# Patient Record
Sex: Male | Born: 1945
Health system: Southern US, Community
[De-identification: ages and names within clinical notes are randomized; demographics above are authoritative.]

## PROBLEM LIST (undated history)

## (undated) DIAGNOSIS — M1990 Unspecified osteoarthritis, unspecified site: Secondary | ICD-10-CM

## (undated) DIAGNOSIS — I10 Essential (primary) hypertension: Secondary | ICD-10-CM

## (undated) DIAGNOSIS — R7301 Impaired fasting glucose: Secondary | ICD-10-CM

## (undated) HISTORY — DX: Impaired fasting glucose: R73.01

## (undated) HISTORY — DX: Essential (primary) hypertension: I10

---
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
## 2004-10-31 ENCOUNTER — Ambulatory Visit: Admission: RE | Admit: 2004-10-31 | Discharge: 2004-10-31 | Payer: Self-pay | Admitting: Orthopedic Surgery

## 2005-01-29 HISTORY — PX: OTHER SURGICAL HISTORY: SHX169

## 2005-01-29 HISTORY — PX: JOINT REPLACEMENT: SHX530

## 2005-04-09 ENCOUNTER — Inpatient Hospital Stay (HOSPITAL_COMMUNITY): Admission: RE | Admit: 2005-04-09 | Discharge: 2005-04-12 | Payer: Self-pay | Admitting: Orthopedic Surgery

## 2005-04-09 ENCOUNTER — Ambulatory Visit: Payer: Self-pay | Admitting: Physical Medicine & Rehabilitation

## 2005-07-02 ENCOUNTER — Inpatient Hospital Stay (HOSPITAL_COMMUNITY): Admission: RE | Admit: 2005-07-02 | Discharge: 2005-07-05 | Payer: Self-pay | Admitting: Orthopedic Surgery

## 2007-04-07 IMAGING — CR DG PORTABLE PELVIS
1 series · 1 of 1 positions shown · non-contrast
Comparison: none

CLINICAL DATA: Post-op left hip.
 PORTABLE PELVIS ? 1 VIEW ? 04/09/05 AT 7197 HOURS:

[view not recorded]
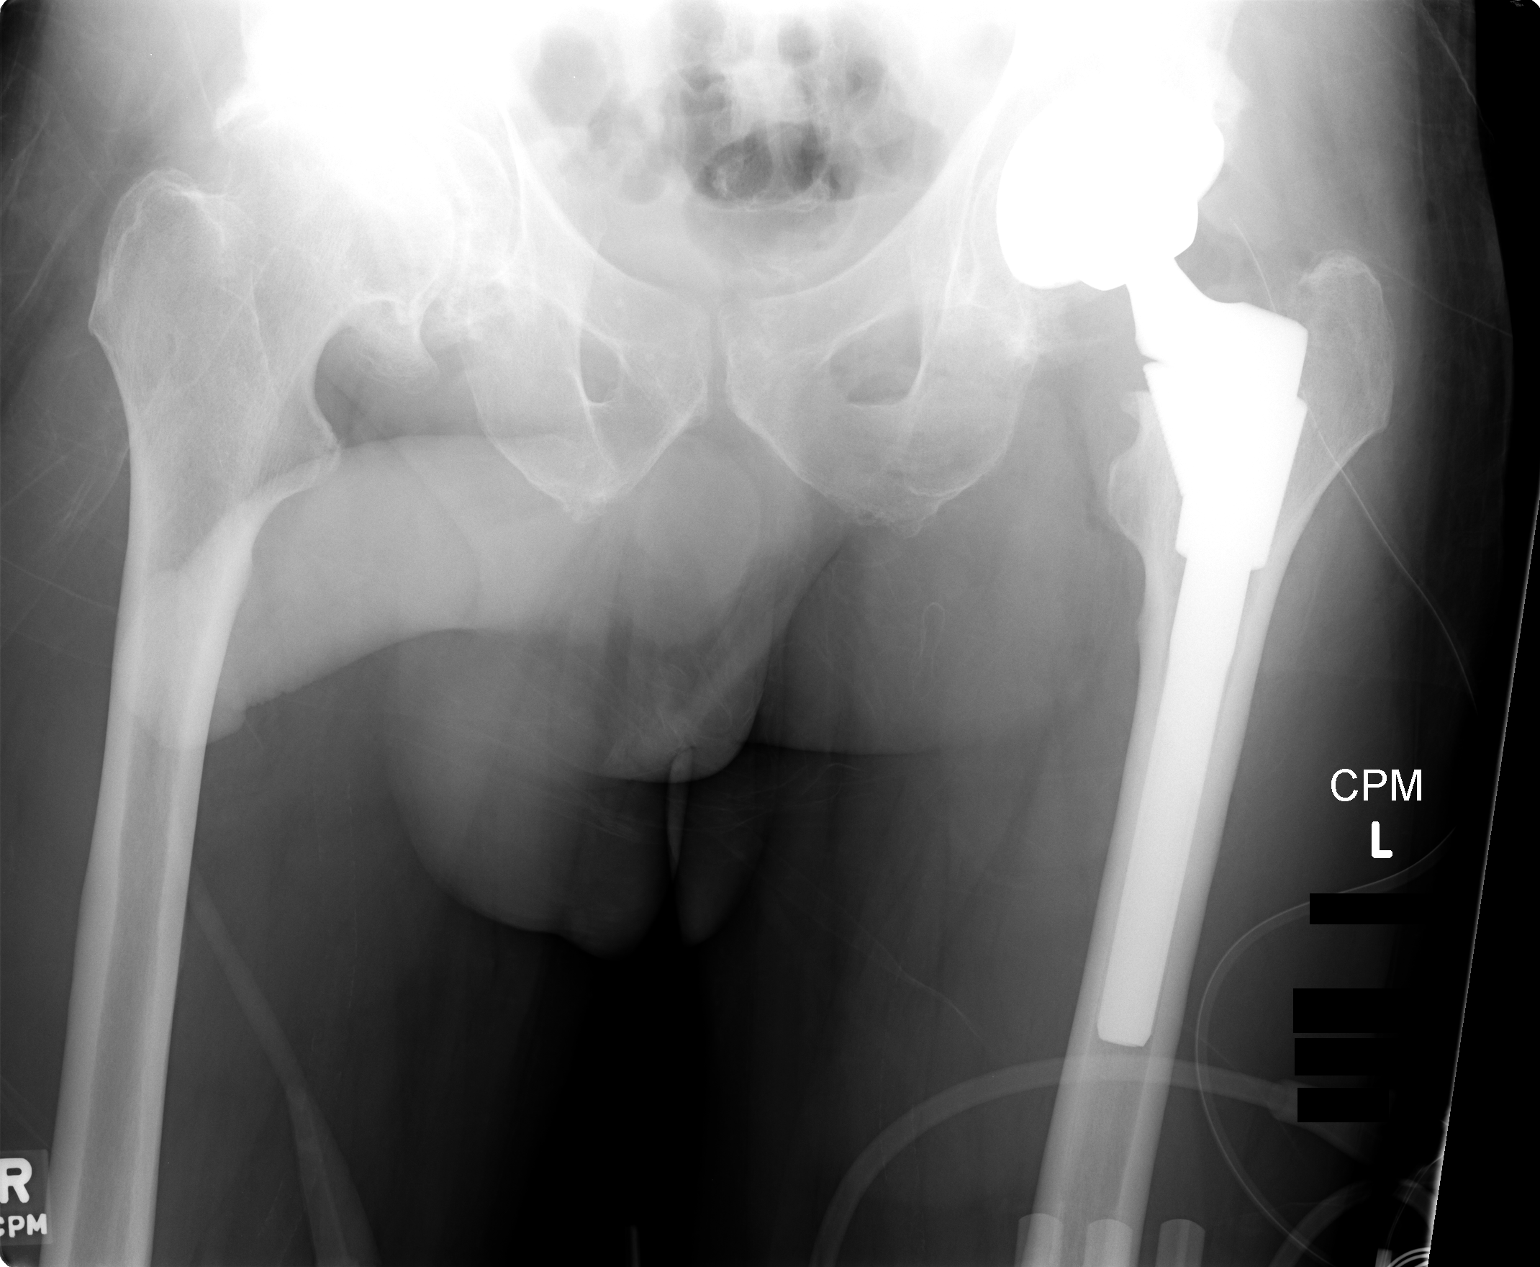

[1 of 1 positions shown; findings below may reference images not displayed]

FINDINGS: Total hip arthroplasty on the left is present.  There is anatomic alignment.  There is no breakage or loosening of the hardware.
IMPRESSION: Left total hip arthroplasty without hardware breakage.  Alignment is anatomic.

## 2007-04-07 IMAGING — CR DG HIP 1V PORT*L*
1 series · 1 of 1 positions shown · non-contrast
Comparison: none

CLINICAL DATA: Post-op left hip.
 PORTABLE LEFT HIP ? 1 VIEW ? 04/09/05 AT 0637 HOURS:

[view not recorded]
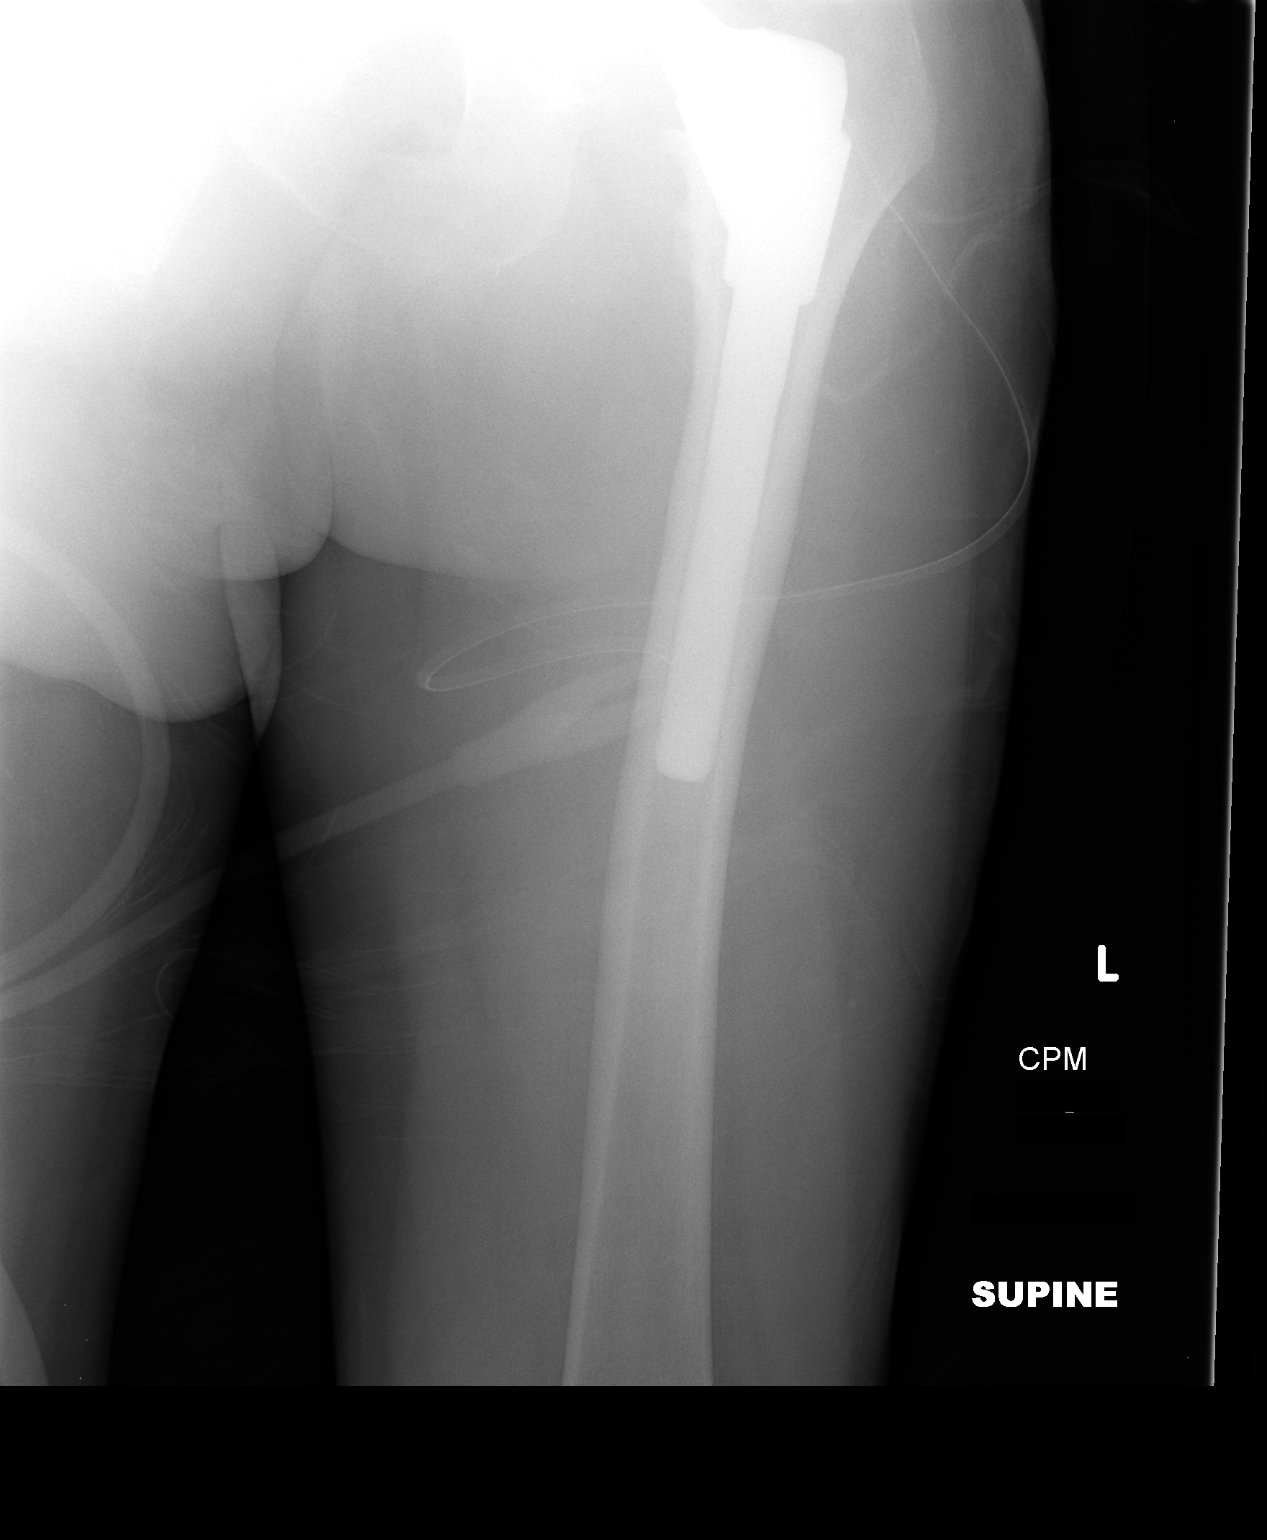

[1 of 1 positions shown; findings below may reference images not displayed]

FINDINGS: A left hip total arthroplasty is present.  The acetabular component is not see on this film.  There is anatomic alignment of the femoral component without breakage or loosening.  A surgical drain projects over the operative site.
IMPRESSION: Total hip arthroplasty on the left without hardware breakage.  Alignment is anatomic.

## 2007-06-26 IMAGING — CR DG HIP COMPLETE 2+V*R*
3 series · 3 of 3 positions shown · non-contrast
Comparison: none

CLINICAL DATA: Pre-arthroplasty. Degenerative changes.   
 RIGHT HIP ? 2 VIEW WITH ADDITIONAL AP PELVIS:
 Severe right hip joint degenerative changes with bony overgrowth.  Subchondral remodeling of the acetabulum. Status-post total left hip replacement.

[view not recorded (1 of 3)]
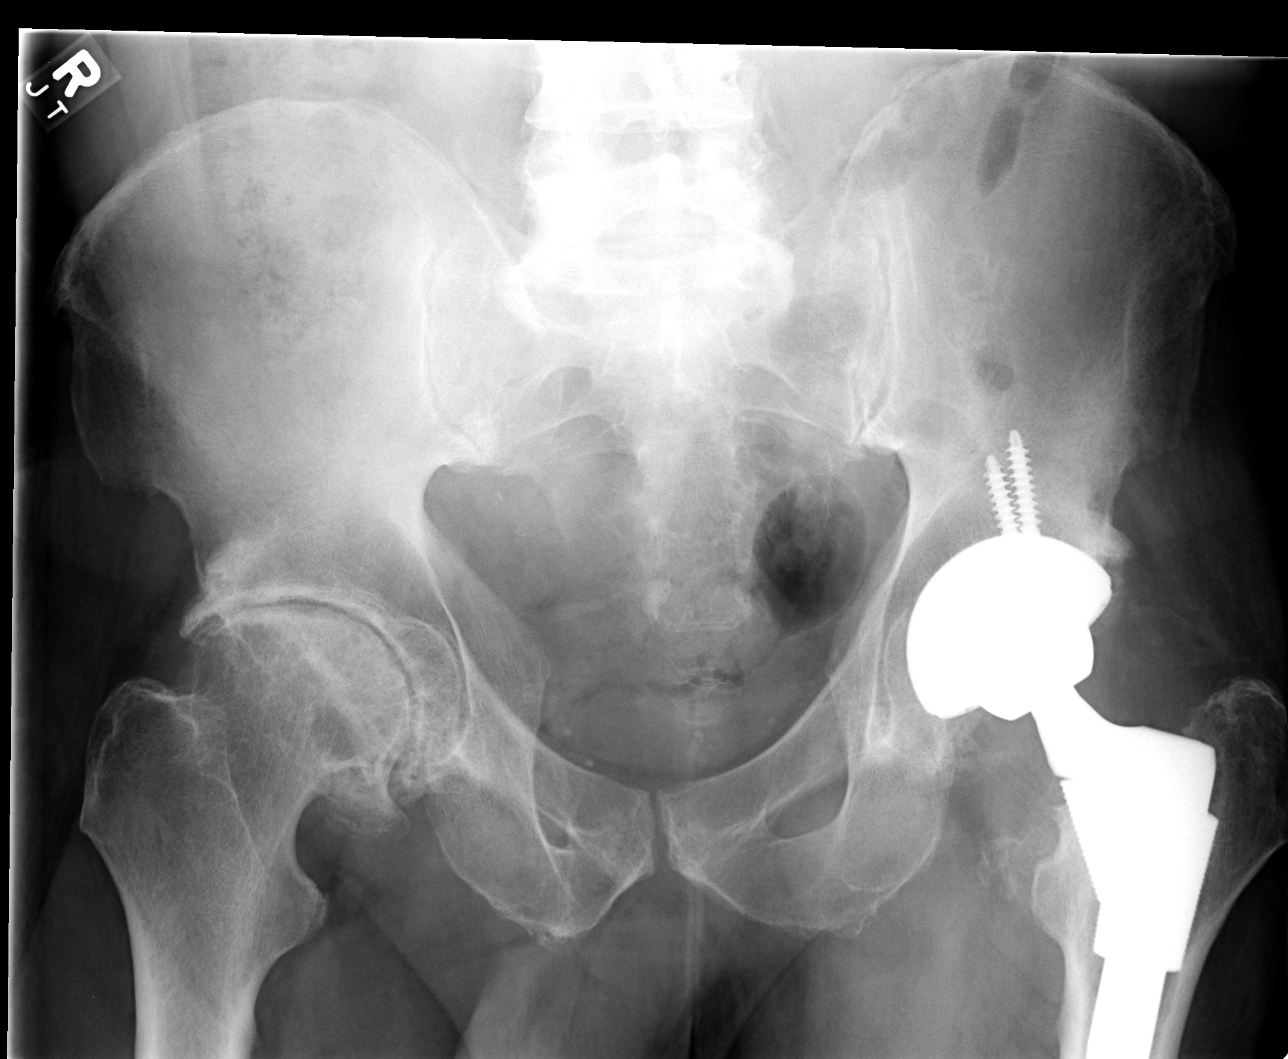

[view not recorded (2 of 3)]
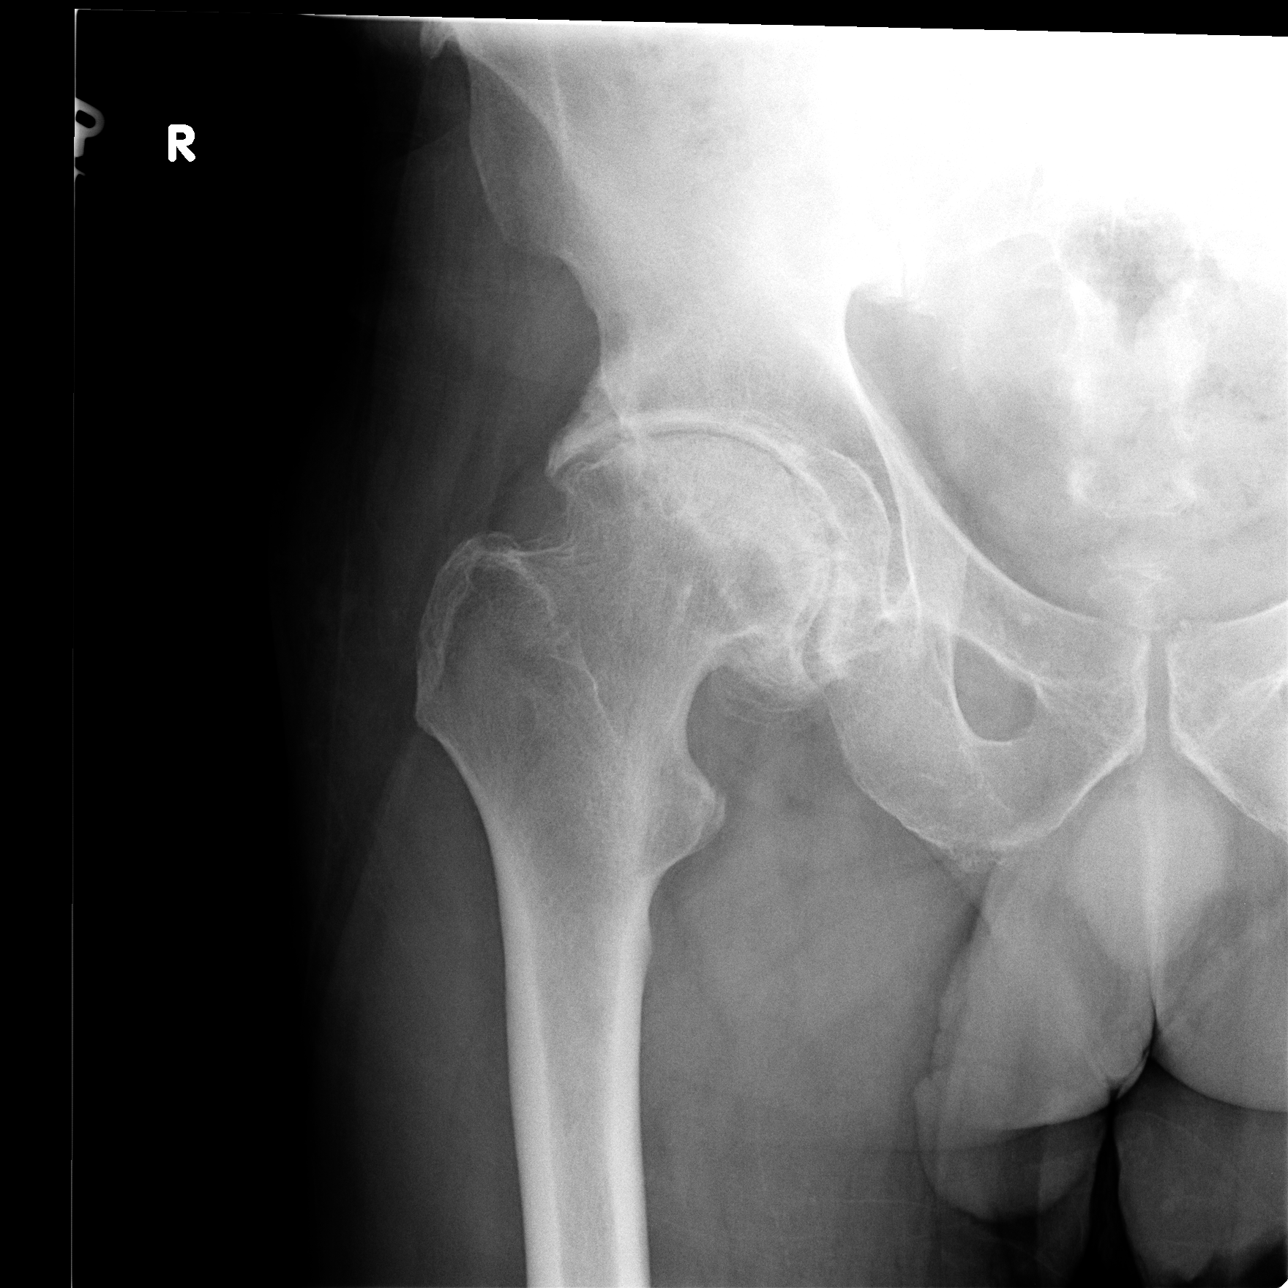

[view not recorded (3 of 3)]
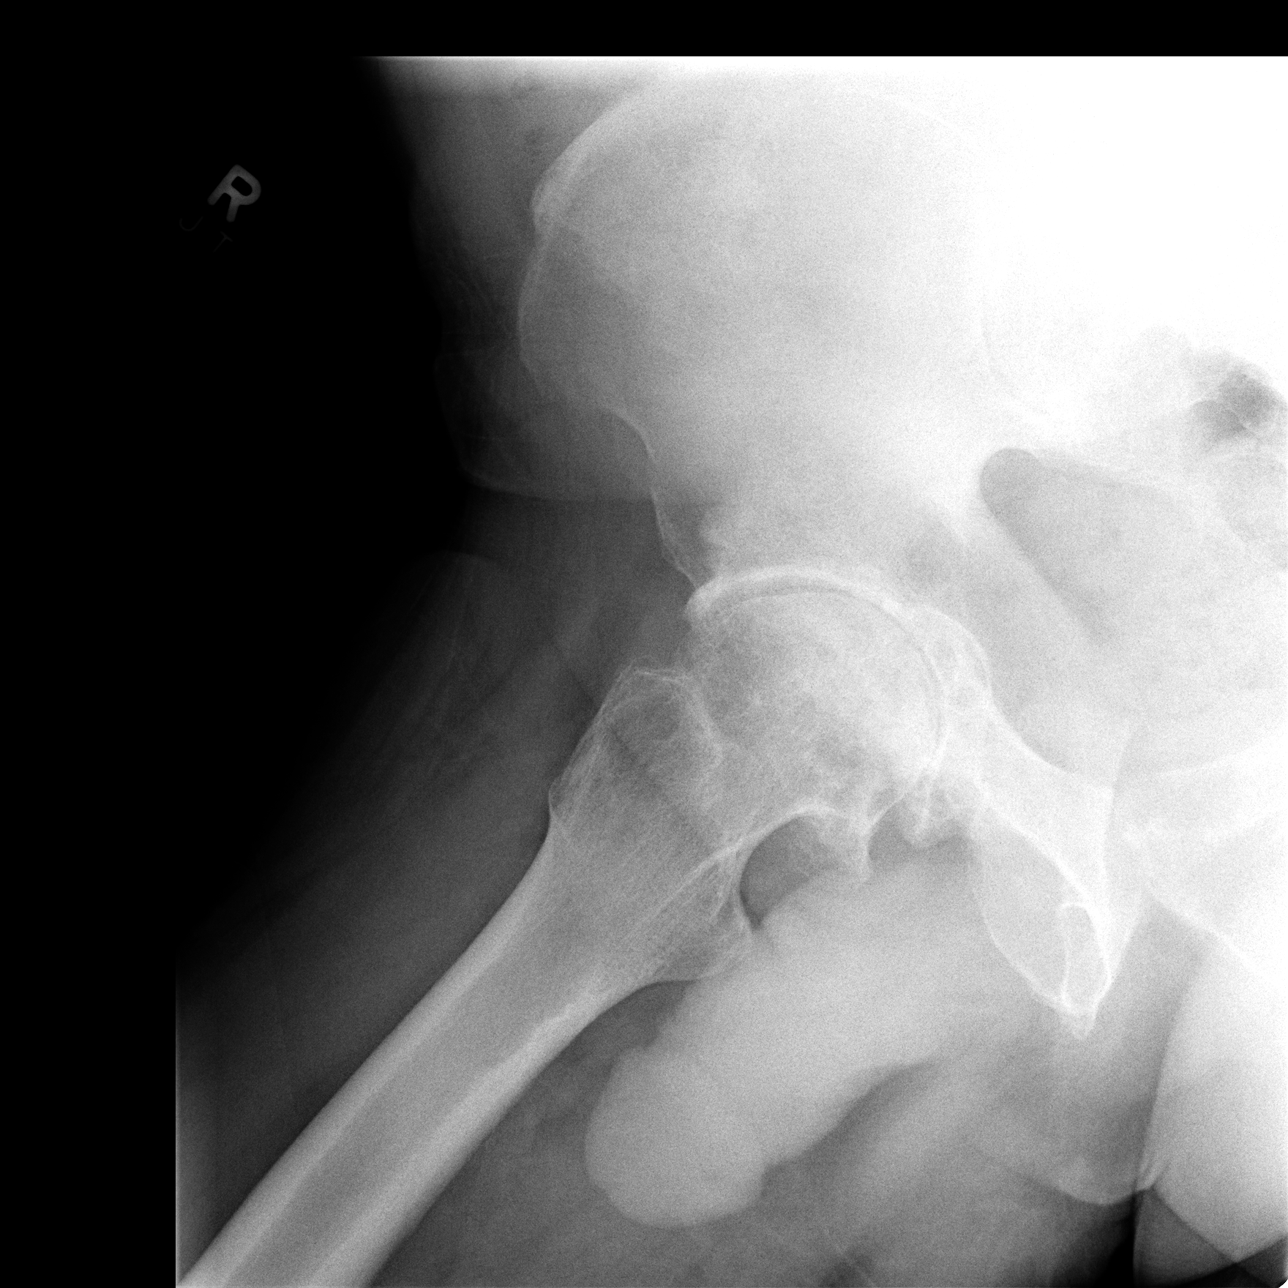

[3 of 3 positions shown; findings below may reference images not displayed]

IMPRESSION: Severe right hip joint degenerative changes with remodeling of bone.

## 2011-03-15 DIAGNOSIS — R7301 Impaired fasting glucose: Secondary | ICD-10-CM | POA: Diagnosis not present

## 2011-03-15 DIAGNOSIS — R030 Elevated blood-pressure reading, without diagnosis of hypertension: Secondary | ICD-10-CM | POA: Diagnosis not present

## 2011-04-19 DIAGNOSIS — M1710 Unilateral primary osteoarthritis, unspecified knee: Secondary | ICD-10-CM | POA: Diagnosis not present

## 2011-06-05 DIAGNOSIS — M1710 Unilateral primary osteoarthritis, unspecified knee: Secondary | ICD-10-CM | POA: Diagnosis not present

## 2011-09-19 DIAGNOSIS — R030 Elevated blood-pressure reading, without diagnosis of hypertension: Secondary | ICD-10-CM | POA: Diagnosis not present

## 2012-01-30 HISTORY — PX: HERNIA REPAIR: SHX51

## 2012-04-11 DIAGNOSIS — E785 Hyperlipidemia, unspecified: Secondary | ICD-10-CM | POA: Diagnosis not present

## 2012-04-11 DIAGNOSIS — R5381 Other malaise: Secondary | ICD-10-CM | POA: Diagnosis not present

## 2012-04-12 ENCOUNTER — Encounter: Payer: Self-pay | Admitting: *Deleted

## 2012-04-21 ENCOUNTER — Encounter: Payer: Self-pay | Admitting: Family Medicine

## 2012-04-21 ENCOUNTER — Ambulatory Visit (INDEPENDENT_AMBULATORY_CARE_PROVIDER_SITE_OTHER): Payer: BC Managed Care – PPO | Admitting: Family Medicine

## 2012-04-21 VITALS — BP 156/94 | HR 80 | Ht 74.0 in | Wt 224.8 lb

## 2012-04-21 DIAGNOSIS — R7301 Impaired fasting glucose: Secondary | ICD-10-CM

## 2012-04-21 DIAGNOSIS — I10 Essential (primary) hypertension: Secondary | ICD-10-CM

## 2012-04-21 DIAGNOSIS — E785 Hyperlipidemia, unspecified: Secondary | ICD-10-CM | POA: Diagnosis not present

## 2012-04-21 MED ORDER — ENALAPRIL MALEATE 5 MG PO TABS: 5.0000 mg | ORAL_TABLET | Freq: Every day | ORAL | Status: DC

## 2012-04-21 NOTE — Progress Notes (Signed)
  Subjective:    Patient ID: Omar Taylor, male    DOB: [birth year], 67 y.o.   MRN: [id number]  HPI Patient arrives for his annual physical. He does have a history of impaired fasting glucose. He has been trying to watch his diet in this regard. Also blood pressures have been elevated the last couple years. Exercising some but not a lot. Notes ongoing stiffness from hip replacement, but certainly better than before. We have been discussing his blood pressure for some time, and had advised the patient still at this time would need to initiate medications. Discussed with patient.   Review of Systems     Objective:   Physical Exam  Vitals reviewed. Constitutional: He is oriented to person, place, and time. He appears well-developed and well-nourished.  HENT:  Head: Normocephalic and atraumatic.  Eyes: Conjunctivae are normal. Pupils are equal, round, and reactive to light.  Neck: Normal range of motion. Neck supple.  Cardiovascular: Normal rate, regular rhythm and normal heart sounds.   Pulmonary/Chest: Effort normal and breath sounds normal.  Abdominal: Soft. Bowel sounds are normal.  Genitourinary: Rectum normal, prostate normal and penis normal.  Musculoskeletal:   Some limitations of hip rotation bilaterally secondary to surgery  Neurological: He is alert and oriented to person, place, and time.  Skin: Skin is warm and dry.          Assessment & Plan:  Annual physical exam-concerns discussed. #2 hypertension time the press on with medication. Plan enalapril prescribed. Blood work reviewed. Patient also has impaired fasting glucose. This persists glucose at 119. LDL mild elevation 117.

## 2012-04-21 NOTE — Patient Instructions (Signed)

## 2012-04-22 DIAGNOSIS — E785 Hyperlipidemia, unspecified: Secondary | ICD-10-CM | POA: Insufficient documentation

## 2012-04-22 DIAGNOSIS — R7301 Impaired fasting glucose: Secondary | ICD-10-CM | POA: Insufficient documentation

## 2012-07-16 ENCOUNTER — Ambulatory Visit: Payer: BC Managed Care – PPO | Admitting: Family Medicine

## 2012-07-30 ENCOUNTER — Ambulatory Visit (INDEPENDENT_AMBULATORY_CARE_PROVIDER_SITE_OTHER): Payer: Medicare Other | Admitting: Family Medicine

## 2012-07-30 ENCOUNTER — Encounter: Payer: Self-pay | Admitting: Family Medicine

## 2012-07-30 VITALS — BP 148/100 | HR 80 | Wt 218.4 lb

## 2012-07-30 DIAGNOSIS — E785 Hyperlipidemia, unspecified: Secondary | ICD-10-CM

## 2012-07-30 DIAGNOSIS — I10 Essential (primary) hypertension: Secondary | ICD-10-CM | POA: Diagnosis not present

## 2012-07-30 DIAGNOSIS — R7301 Impaired fasting glucose: Secondary | ICD-10-CM | POA: Diagnosis not present

## 2012-07-30 NOTE — Progress Notes (Signed)
  Subjective:    Patient ID: Omar Taylor, male    DOB: 08-22-1945, 67 y.o.   MRN: [id number]  Hypertension This is a chronic problem. The current episode started more than 1 year ago. The problem has been gradually improving since onset. Pertinent negatives include no anxiety, blurred vision or chest pain. There are no associated agents to hypertension. There are no known risk factors for coronary artery disease. Past treatments include ACE inhibitors. The current treatment provides moderate improvement.      Review of Systems  Eyes: Negative for blurred vision.  Cardiovascular: Negative for chest pain.       Objective:   Physical Exam Alert no acute distress. Blood pressure repeat 1:30 or 70. Lungs clear. Heart regular in rhythm. H&T normal.       Assessment & Plan:  Impression 1 hypertension good control. Plan diet exercise discussed. Maintain same meds. Check every 6 months. WSL

## 2012-07-30 NOTE — Patient Instructions (Signed)
Stay with the same dose of medicine

## 2012-07-31 ENCOUNTER — Ambulatory Visit: Payer: BC Managed Care – PPO | Admitting: Family Medicine

## 2012-10-22 ENCOUNTER — Encounter: Payer: Self-pay | Admitting: Family Medicine

## 2012-10-22 ENCOUNTER — Ambulatory Visit (INDEPENDENT_AMBULATORY_CARE_PROVIDER_SITE_OTHER): Payer: Medicare Other | Admitting: Family Medicine

## 2012-10-22 VITALS — BP 142/88 | Ht 74.0 in | Wt 217.2 lb

## 2012-10-22 DIAGNOSIS — K4090 Unilateral inguinal hernia, without obstruction or gangrene, not specified as recurrent: Secondary | ICD-10-CM

## 2012-10-22 NOTE — Progress Notes (Signed)
  Subjective:    Patient ID: Omar Taylor, male    DOB: 12-07-1945, 67 y.o.   MRN: [id number]  HPI  Patient arrives with a possible hernia in lower stomach. Worse with activity.  rlq swelling size of an egg  Worse with physical activity  Radiates to abd discomfort  Three to four months  Not affecting activities greatly  occas nausea with it.,   [phone] Review of Systems No chest pain no back pain no change in bowel habits    Objective:   Physical Exam  Lungs clear. Heart regular in rhythm. Vitals reviewed. Abdomen soft benign. Right inguinal region distinct hernia palpated. To testicles normal.      Assessment & Plan:  Impression inguinal hernia discussed at great length. Plan 25 minutes spent most in discussion. Discussed pros and cons of getting on with evaluation and management, at this time patient would now like to press for which I think is a good idea. WSL

## 2012-11-04 ENCOUNTER — Encounter (HOSPITAL_COMMUNITY): Payer: Self-pay | Admitting: Pharmacy Technician

## 2012-11-04 DIAGNOSIS — K432 Incisional hernia without obstruction or gangrene: Secondary | ICD-10-CM | POA: Diagnosis not present

## 2012-11-04 NOTE — H&P (Signed)
NTS SOAP Note  Vital Signs:  Vitals as of: 11/04/2012: Systolic 150: Diastolic 103: Heart Rate 92: Temp 98.64F: Height 33ft 2in: Weight 218Lbs 0 Ounces: Pain Level 5: BMI 27.99  BMI : 27.99 kg/m2  Subjective: This 67 Years 2 Months old Male presents for of    HERNIA: ,Has had worsening right groin swellilng and pain over the past few months.  Made worse with straining.  Review of Symptoms:  Constitutional:unremarkable   Head:unremarkable    Eyes:unremarkable   Nose/Mouth/Throat:unremarkable Cardiovascular:  unremarkable   Respiratory:unremarkable   Gastrointestinal:  unremarkable   Genitourinary:unremarkable     Musculoskeletal:unremarkable   Skin:unremarkable Hematolgic/Lymphatic:unremarkable     Allergic/Immunologic:unremarkable     Past Medical History:    Reviewed   Past Medical History  Surgical History: bilateral hip replacement, hemorrhoidectomy Medical Problems: BP med Allergies: nkda Medications: bp med   Social History:Reviewed  Social History  Preferred Language: English Race:  White Ethnicity: Not Hispanic / Latino Age: 67 Years 2 Months Marital Status:  S Alcohol:  No Recreational drug(s):  No   Smoking Status: Never smoker reviewed on 11/04/2012 Functional Status reviewed on mm/dd/yyyy ------------------------------------------------ Bathing: Normal Cooking: Normal Dressing: Normal Driving: Normal Eating: Normal Managing Meds: Normal Oral Care: Normal Shopping: Normal Toileting: Normal Transferring: Normal Walking: Normal Cognitive Status reviewed on mm/dd/yyyy ------------------------------------------------ Attention: Normal Decision Making: Normal Language: Normal Memory: Normal Motor: Normal Perception: Normal Problem Solving: Normal Visual and Spatial: Normal   Family History:  Reviewed  Family Health History Mother, Deceased; Healthy; unknown Father, Deceased; Healthy;  healthy    Objective Information: General:  Well appearing, well nourished in no distress. Heart:  RRR, no murmur Lungs:    CTA bilaterally, no wheezes, rhonchi, rales.  Breathing unlabored. Abdomen:Soft, NT/ND, no HSM, no masses.  Reducible large right inguinal hernia, small left inguinal hernia. VH:[id number]    Assessment:Right inguinal hernia  Diagnosis &amp; Procedure Smart Code   Plan:Scheduled for right inguinal herniorrhaphy on 11/10/12.   Patient Education:Alternative treatments to surgery were discussed with patient (and family).  Risks and benefits  of procedure including bleeding, infection, and recurrence were fully explained to the patient (and family) who gave informed consent. Patient/family questions were addressed.  Follow-up:Pending Surgery

## 2012-11-05 NOTE — Patient Instructions (Signed)
Omar Taylor  11/05/2012   Your procedure is scheduled on:  11/10/12  Report to [facility] at 0730 AM.  Call this number if you have problems the morning of surgery: [phone]   Remember:   Do not eat food or drink liquids after midnight.   Take these medicines the morning of surgery with A SIP OF WATER: vasotec   Do not wear jewelry, make-up or nail polish.  Do not wear lotions, powders, or perfumes. You may wear deodorant.  Do not shave 48 hours prior to surgery. Men may shave face and neck.  Do not bring valuables to the hospital.  [facility] is not responsible                  for any belongings or valuables.               Contacts, dentures or bridgework may not be worn into surgery.  Leave suitcase in the car. After surgery it may be brought to your room.  For patients admitted to the hospital, discharge time is determined by your                treatment team.               Patients discharged the day of surgery will not be allowed to drive  home.  Name and phone number of your driver: family  Special Instructions: Shower using CHG 2 nights before surgery and the night before surgery.  If you shower the day of surgery use CHG.  Use special wash - you have one bottle of CHG for all showers.  You should use approximately 1/3 of the bottle for each shower.   Please read over the following fact sheets that you were given: Pain Booklet, Surgical Site Infection Prevention, Anesthesia Post-op Instructions and Care and Recovery After Surgery   PATIENT INSTRUCTIONS POST-ANESTHESIA  IMMEDIATELY FOLLOWING SURGERY:  Do not drive or operate machinery for the first twenty four hours after surgery.  Do not make any important decisions for twenty four hours after surgery or while taking narcotic pain medications or sedatives.  If you develop intractable nausea and vomiting or a severe headache please notify your doctor immediately.  FOLLOW-UP:  Please make an appointment with your  surgeon as instructed. You do not need to follow up with anesthesia unless specifically instructed to do so.  WOUND CARE INSTRUCTIONS (if applicable):  Keep a dry clean dressing on the anesthesia/puncture wound site if there is drainage.  Once the wound has quit draining you may leave it open to air.  Generally you should leave the bandage intact for twenty four hours unless there is drainage.  If the epidural site drains for more than 36-48 hours please call the anesthesia department.  QUESTIONS?:  Please feel free to call your physician or the hospital operator if you have any questions, and they will be happy to assist you.      Inguinal Hernia, Adult Muscles help keep everything in the body in its proper place. But if a weak spot in the muscles develops, something can poke through. That is called a hernia. When this happens in the lower part of the belly (abdomen), it is called an inguinal hernia. (It takes its name from a part of the body in this region called the inguinal canal.) A weak spot in the wall of muscles lets some fat or part of the small intestine bulge through. An inguinal hernia can  develop at any age. Men get them more often than women. CAUSES  In adults, an inguinal hernia develops over time.  It can be triggered by:  Suddenly straining the muscles of the lower abdomen.  Lifting heavy objects.  Straining to have a bowel movement. Difficult bowel movements (constipation) can lead to this.  Constant coughing. This may be caused by smoking or lung disease.  Being overweight.  Being pregnant.  Working at a job that requires long periods of standing or heavy lifting.  Having had an inguinal hernia before. One type can be an emergency situation. It is called a strangulated inguinal hernia. It develops if part of the small intestine slips through the weak spot and cannot get back into the abdomen. The blood supply can be cut off. If that happens, part of the intestine may  die. This situation requires emergency surgery. SYMPTOMS  Often, a small inguinal hernia has no symptoms. It is found when a healthcare provider does a physical exam. Larger hernias usually have symptoms.   In adults, symptoms may include:  A lump in the groin. This is easier to see when the person is standing. It might disappear when lying down.  In men, a lump in the scrotum.  Pain or burning in the groin. This occurs especially when lifting, straining or coughing.  A dull ache or feeling of pressure in the groin.  Signs of a strangulated hernia can include:  A bulge in the groin that becomes very painful and tender to the touch.  A bulge that turns red or purple.  Fever, nausea and vomiting.  Inability to have a bowel movement or to pass gas. DIAGNOSIS  To decide if you have an inguinal hernia, a healthcare provider will probably do a physical examination.  This will include asking questions about any symptoms you have noticed.  The healthcare provider might feel the groin area and ask you to cough. If an inguinal hernia is felt, the healthcare provider may try to slide it back into the abdomen.  Usually no other tests are needed. TREATMENT  Treatments can vary. The size of the hernia makes a difference. Options include:  Watchful waiting. This is often suggested if the hernia is small and you have had no symptoms.  No medical procedure will be done unless symptoms develop.  You will need to watch closely for symptoms. If any occur, contact your healthcare provider right away.  Surgery. This is used if the hernia is larger or you have symptoms.  Open surgery. This is usually an outpatient procedure (you will not stay overnight in a hospital). An cut (incision) is made through the skin in the groin. The hernia is put back inside the abdomen. The weak area in the muscles is then repaired by herniorrhaphy or hernioplasty. Herniorrhaphy: in this type of surgery, the weak  muscles are sewn back together. Hernioplasty: a patch or mesh is used to close the weak area in the abdominal wall.  Laparoscopy. In this procedure, a surgeon makes small incisions. A thin tube with a tiny video camera (called a laparoscope) is put into the abdomen. The surgeon repairs the hernia with mesh by looking with the video camera and using two long instruments. HOME CARE INSTRUCTIONS   After surgery to repair an inguinal hernia:  You will need to take pain medicine prescribed by your healthcare provider. Follow all directions carefully.  You will need to take care of the wound from the incision.  Your activity will  be restricted for awhile. This will probably include no heavy lifting for several weeks. You also should not do anything too active for a few weeks. When you can return to work will depend on the type of job that you have.  During "watchful waiting" periods, you should:  Maintain a healthy weight.  Eat a diet high in fiber (fruits, vegetables and whole grains).  Drink plenty of fluids to avoid constipation. This means drinking enough water and other liquids to keep your urine clear or pale yellow.  Do not lift heavy objects.  Do not stand for long periods of time.  Quit smoking. This should keep you from developing a frequent cough. SEEK MEDICAL CARE IF:   A bulge develops in your groin area.  You feel pain, a burning sensation or pressure in the groin. This might be worse if you are lifting or straining.  You develop a fever of more than 100.5 F (38.1 C). SEEK IMMEDIATE MEDICAL CARE IF:   Pain in the groin increases suddenly.  A bulge in the groin gets bigger suddenly and does not go down.  For men, there is sudden pain in the scrotum. Or, the size of the scrotum increases.  A bulge in the groin area becomes red or purple and is painful to touch.  You have nausea or vomiting that does not go away.  You feel your heart beating much faster than  normal.  You cannot have a bowel movement or pass gas.  You develop a fever of more than 102.0 F (38.9 C). Document Released: 06/03/2008 Document Revised: 04/09/2011 Document Reviewed: 06/03/2008 [facility] Patient Information 2014 [place], [place].

## 2012-11-06 ENCOUNTER — Encounter (HOSPITAL_COMMUNITY)
Admission: RE | Admit: 2012-11-06 | Discharge: 2012-11-06 | Disposition: A | Discharge status: Home / Self Care | Payer: Medicare Other | Source: Ambulatory Visit | Attending: General Surgery | Admitting: General Surgery

## 2012-11-06 ENCOUNTER — Encounter (HOSPITAL_COMMUNITY): Payer: Self-pay

## 2012-11-06 ENCOUNTER — Other Ambulatory Visit: Payer: Self-pay

## 2012-11-06 DIAGNOSIS — Z01810 Encounter for preprocedural cardiovascular examination: Secondary | ICD-10-CM | POA: Insufficient documentation

## 2012-11-06 DIAGNOSIS — Z01818 Encounter for other preprocedural examination: Secondary | ICD-10-CM | POA: Diagnosis not present

## 2012-11-06 DIAGNOSIS — Z01812 Encounter for preprocedural laboratory examination: Secondary | ICD-10-CM | POA: Diagnosis not present

## 2012-11-06 LAB — CBC WITH DIFFERENTIAL/PLATELET
Basophils Absolute: 0 10*3/uL (ref 0.0–0.1)
Basophils Relative: 1 % (ref 0–1)
Eosinophils Absolute: 0.4 10*3/uL (ref 0.0–0.7)
Eosinophils Relative: 5 % (ref 0–5)
HCT: 46.4 % (ref 39.0–52.0)
Hemoglobin: 15.7 g/dL (ref 13.0–17.0)
Lymphocytes Relative: 26 % (ref 12–46)
Lymphs Abs: 2.1 10*3/uL (ref 0.7–4.0)
MCH: 30.5 pg (ref 26.0–34.0)
MCHC: 33.8 g/dL (ref 30.0–36.0)
MCV: 90.1 fL (ref 78.0–100.0)
Monocytes Absolute: 0.7 10*3/uL (ref 0.1–1.0)
Monocytes Relative: 8 % (ref 3–12)
Neutro Abs: 5.1 10*3/uL (ref 1.7–7.7)
Neutrophils Relative %: 61 % (ref 43–77)
Platelets: 235 10*3/uL (ref 150–400)
RBC: 5.15 MIL/uL (ref 4.22–5.81)
RDW: 13.1 % (ref 11.5–15.5)
WBC: 8.3 10*3/uL (ref 4.0–10.5)

## 2012-11-06 LAB — BASIC METABOLIC PANEL
BUN: 14 mg/dL (ref 6–23)
CO2: 27 mEq/L (ref 19–32)
Calcium: 10.1 mg/dL (ref 8.4–10.5)
Chloride: 101 mEq/L (ref 96–112)
Creatinine, Ser: 0.78 mg/dL (ref 0.50–1.35)
GFR calc Af Amer: >90 mL/min (ref >90)
GFR calc non Af Amer: >90 mL/min (ref >90)
Glucose, Bld: 103 mg/dL — ABNORMAL HIGH (ref 70–99)
Potassium: 4.4 mEq/L (ref 3.5–5.1)
Sodium: 138 mEq/L (ref 135–145)

## 2012-11-10 ENCOUNTER — Encounter (HOSPITAL_COMMUNITY): Payer: Medicare Other | Admitting: Anesthesiology

## 2012-11-10 ENCOUNTER — Encounter (HOSPITAL_COMMUNITY)
Admission: RE | Disposition: A | Discharge status: Home / Self Care | Payer: Self-pay | Source: Ambulatory Visit | Attending: General Surgery

## 2012-11-10 ENCOUNTER — Ambulatory Visit (HOSPITAL_COMMUNITY): Payer: Medicare Other | Admitting: Anesthesiology

## 2012-11-10 ENCOUNTER — Encounter (HOSPITAL_COMMUNITY): Payer: Self-pay | Admitting: *Deleted

## 2012-11-10 ENCOUNTER — Ambulatory Visit (HOSPITAL_COMMUNITY)
Admission: RE | Admit: 2012-11-10 | Discharge: 2012-11-10 | Disposition: A | Discharge status: Home / Self Care | Payer: Medicare Other | Source: Ambulatory Visit | Attending: General Surgery | Admitting: General Surgery

## 2012-11-10 DIAGNOSIS — K4091 Unilateral inguinal hernia, without obstruction or gangrene, recurrent: Secondary | ICD-10-CM | POA: Insufficient documentation

## 2012-11-10 DIAGNOSIS — K4090 Unilateral inguinal hernia, without obstruction or gangrene, not specified as recurrent: Secondary | ICD-10-CM | POA: Diagnosis not present

## 2012-11-10 HISTORY — PX: INGUINAL HERNIA REPAIR: SHX194

## 2012-11-10 HISTORY — DX: Unspecified osteoarthritis, unspecified site: M19.90

## 2012-11-10 SURGERY — REPAIR, HERNIA, INGUINAL, ADULT
Anesthesia: General | Site: Groin | Laterality: Right | Wound class: Clean

## 2012-11-10 MED ORDER — SUCCINYLCHOLINE CHLORIDE 20 MG/ML IJ SOLN
INTRAMUSCULAR | Status: AC
  Filled 2012-11-10: qty 1

## 2012-11-10 MED ORDER — LIDOCAINE HCL (PF) 1 % IJ SOLN
INTRAMUSCULAR | Status: AC
  Filled 2012-11-10: qty 5

## 2012-11-10 MED ORDER — MIDAZOLAM HCL 2 MG/2ML IJ SOLN
INTRAMUSCULAR | Status: AC
  Filled 2012-11-10: qty 2

## 2012-11-10 MED ORDER — FENTANYL CITRATE 0.05 MG/ML IJ SOLN: 25.0000 ug | INTRAMUSCULAR | Status: DC | PRN

## 2012-11-10 MED ORDER — FENTANYL CITRATE 0.05 MG/ML IJ SOLN
INTRAMUSCULAR | Status: AC
  Filled 2012-11-10: qty 5

## 2012-11-10 MED ORDER — BUPIVACAINE HCL (PF) 0.5 % IJ SOLN
INTRAMUSCULAR | Status: DC | PRN
  Administered 2012-11-10: 10 mL

## 2012-11-10 MED ORDER — SODIUM CHLORIDE 0.9 % IR SOLN
Status: DC | PRN
  Administered 2012-11-10: 1000 mL

## 2012-11-10 MED ORDER — FENTANYL CITRATE 0.05 MG/ML IJ SOLN
25.0000 ug | INTRAMUSCULAR | Status: AC
  Administered 2012-11-10 (×2): 25 ug via INTRAVENOUS

## 2012-11-10 MED ORDER — BUPIVACAINE HCL (PF) 0.5 % IJ SOLN
INTRAMUSCULAR | Status: AC
  Filled 2012-11-10: qty 30

## 2012-11-10 MED ORDER — CHLORHEXIDINE GLUCONATE 4 % EX LIQD: 1.0000 "application " | Freq: Once | CUTANEOUS | Status: DC

## 2012-11-10 MED ORDER — LIDOCAINE HCL (CARDIAC) 20 MG/ML IV SOLN
INTRAVENOUS | Status: DC | PRN
  Administered 2012-11-10: 50 mg via INTRAVENOUS

## 2012-11-10 MED ORDER — ONDANSETRON HCL 4 MG/2ML IJ SOLN
INTRAMUSCULAR | Status: AC
  Filled 2012-11-10: qty 2

## 2012-11-10 MED ORDER — ONDANSETRON HCL 4 MG/2ML IJ SOLN
4.0000 mg | Freq: Once | INTRAMUSCULAR | Status: AC
  Administered 2012-11-10: 4 mg via INTRAVENOUS

## 2012-11-10 MED ORDER — MIDAZOLAM HCL 2 MG/2ML IJ SOLN
1.0000 mg | INTRAMUSCULAR | Status: DC | PRN
Start: 2012-11-10 — End: 2012-11-11
  Administered 2012-11-10 (×2): 2 mg via INTRAVENOUS

## 2012-11-10 MED ORDER — EPHEDRINE SULFATE 50 MG/ML IJ SOLN
INTRAMUSCULAR | Status: DC | PRN
  Administered 2012-11-10 (×2): 10 mg via INTRAVENOUS

## 2012-11-10 MED ORDER — CEFAZOLIN SODIUM-DEXTROSE 2-3 GM-% IV SOLR
INTRAVENOUS | Status: AC
  Filled 2012-11-10: qty 50

## 2012-11-10 MED ORDER — FENTANYL CITRATE 0.05 MG/ML IJ SOLN
INTRAMUSCULAR | Status: AC
  Filled 2012-11-10: qty 2

## 2012-11-10 MED ORDER — KETOROLAC TROMETHAMINE 30 MG/ML IJ SOLN
30.0000 mg | Freq: Once | INTRAMUSCULAR | Status: AC
  Administered 2012-11-10: 30 mg via INTRAVENOUS
  Filled 2012-11-10: qty 1

## 2012-11-10 MED ORDER — CEFAZOLIN SODIUM-DEXTROSE 2-3 GM-% IV SOLR
2.0000 g | INTRAVENOUS | Status: AC
  Administered 2012-11-10: 2 g via INTRAVENOUS

## 2012-11-10 MED ORDER — ARTIFICIAL TEARS OP OINT
TOPICAL_OINTMENT | OPHTHALMIC | Status: AC
  Filled 2012-11-10: qty 3.5

## 2012-11-10 MED ORDER — LACTATED RINGERS IV SOLN
INTRAVENOUS | Status: DC | PRN
  Administered 2012-11-10 (×2): via INTRAVENOUS

## 2012-11-10 MED ORDER — ONDANSETRON HCL 4 MG/2ML IJ SOLN: 4.0000 mg | Freq: Once | INTRAMUSCULAR | Status: DC | PRN

## 2012-11-10 MED ORDER — FENTANYL CITRATE 0.05 MG/ML IJ SOLN
INTRAMUSCULAR | Status: DC | PRN
  Administered 2012-11-10 (×4): 50 ug via INTRAVENOUS

## 2012-11-10 MED ORDER — PROPOFOL 10 MG/ML IV BOLUS
INTRAVENOUS | Status: DC | PRN
  Administered 2012-11-10: 180 mg via INTRAVENOUS

## 2012-11-10 MED ORDER — EPHEDRINE SULFATE 50 MG/ML IJ SOLN
INTRAMUSCULAR | Status: AC
  Filled 2012-11-10: qty 1

## 2012-11-10 MED ORDER — PROPOFOL 10 MG/ML IV BOLUS
INTRAVENOUS | Status: AC
  Filled 2012-11-10: qty 20

## 2012-11-10 MED ORDER — DEXAMETHASONE SODIUM PHOSPHATE 4 MG/ML IJ SOLN
4.0000 mg | Freq: Once | INTRAMUSCULAR | Status: AC
  Administered 2012-11-10: 4 mg via INTRAVENOUS

## 2012-11-10 MED ORDER — OXYCODONE-ACETAMINOPHEN 7.5-325 MG PO TABS: 1.0000 | ORAL_TABLET | ORAL | Status: DC | PRN

## 2012-11-10 MED ORDER — DEXAMETHASONE SODIUM PHOSPHATE 4 MG/ML IJ SOLN
INTRAMUSCULAR | Status: AC
  Filled 2012-11-10: qty 1

## 2012-11-10 MED ORDER — LACTATED RINGERS IV SOLN
INTRAVENOUS | Status: DC
  Administered 2012-11-10: 08:00:00 via INTRAVENOUS

## 2012-11-10 SURGICAL SUPPLY — 36 items
ADH SKN CLS APL DERMABOND .7 (GAUZE/BANDAGES/DRESSINGS) ×1
BAG HAMPER (MISCELLANEOUS) ×2 IMPLANT
CLOTH BEACON ORANGE TIMEOUT ST (SAFETY) ×2 IMPLANT
COVER LIGHT HANDLE STERIS (MISCELLANEOUS) ×4 IMPLANT
DECANTER SPIKE VIAL GLASS SM (MISCELLANEOUS) ×2 IMPLANT
DERMABOND ADVANCED (GAUZE/BANDAGES/DRESSINGS) ×1
DERMABOND ADVANCED .7 DNX12 (GAUZE/BANDAGES/DRESSINGS) ×1 IMPLANT
DRAIN PENROSE 18X.75 LTX STRL (MISCELLANEOUS) ×2 IMPLANT
ELECT REM PT RETURN 9FT ADLT (ELECTROSURGICAL) ×2
ELECTRODE REM PT RTRN 9FT ADLT (ELECTROSURGICAL) ×1 IMPLANT
GLOVE BIO SURGEON STRL SZ7.5 (GLOVE) ×2 IMPLANT
GLOVE BIOGEL PI IND STRL 7.0 (GLOVE) IMPLANT
GLOVE BIOGEL PI IND STRL 7.5 (GLOVE) IMPLANT
GLOVE BIOGEL PI INDICATOR 7.0 (GLOVE) ×2
GLOVE BIOGEL PI INDICATOR 7.5 (GLOVE) ×1
GLOVE ECLIPSE 6.5 STRL STRAW (GLOVE) ×1 IMPLANT
GLOVE ECLIPSE 7.0 STRL STRAW (GLOVE) ×1 IMPLANT
GOWN STRL REIN XL XLG (GOWN DISPOSABLE) ×6 IMPLANT
INST SET MINOR GENERAL (KITS) ×2 IMPLANT
KIT ROOM TURNOVER APOR (KITS) ×2 IMPLANT
MANIFOLD NEPTUNE II (INSTRUMENTS) ×2 IMPLANT
MESH MARLEX PLUG MEDIUM (Mesh General) ×1 IMPLANT
NS IRRIG 1000ML POUR BTL (IV SOLUTION) ×2 IMPLANT
PACK MINOR (CUSTOM PROCEDURE TRAY) ×2 IMPLANT
PAD ARMBOARD 7.5X6 YLW CONV (MISCELLANEOUS) ×2 IMPLANT
SET BASIN LINEN APH (SET/KITS/TRAYS/PACK) ×2 IMPLANT
SOL PREP PROV IODINE SCRUB 4OZ (MISCELLANEOUS) ×2 IMPLANT
SUT NOVA NAB GS-22 2 2-0 T-19 (SUTURE) ×2 IMPLANT
SUT VIC AB 2-0 CT1 27 (SUTURE) ×2
SUT VIC AB 2-0 CT1 TAPERPNT 27 (SUTURE) ×1 IMPLANT
SUT VIC AB 3-0 SH 27 (SUTURE) ×2
SUT VIC AB 3-0 SH 27X BRD (SUTURE) IMPLANT
SUT VIC AB 4-0 SH 27 (SUTURE) ×2
SUT VIC AB 4-0 SH 27XBRD (SUTURE) IMPLANT
SUT VICRYL AB 3 0 TIES (SUTURE) ×1 IMPLANT
SYR CONTROL 10ML LL (SYRINGE) ×2 IMPLANT

## 2012-11-10 NOTE — Anesthesia Preprocedure Evaluation (Signed)
Anesthesia Evaluation  Patient identified by MRN, date of birth, ID band Patient awake    Reviewed: Allergy & Precautions, H&P , NPO status , Patient's Chart, lab work & pertinent test results  Airway Mallampati: I TM Distance: >3 FB Neck ROM: Full    Dental  (+) Teeth Intact   Pulmonary  breath sounds clear to auscultation        Cardiovascular hypertension, Pt. on medications Rhythm:Regular Rate:Normal     Neuro/Psych    GI/Hepatic negative GI ROS,   Endo/Other    Renal/GU      Musculoskeletal   Abdominal   Peds  Hematology   Anesthesia Other Findings   Reproductive/Obstetrics                           Anesthesia Physical Anesthesia Plan  ASA: II  Anesthesia Plan: General   Post-op Pain Management:    Induction: Intravenous  Airway Management Planned: LMA  Additional Equipment:   Intra-op Plan:   Post-operative Plan: Extubation in OR  Informed Consent: I have reviewed the patients History and Physical, chart, labs and discussed the procedure including the risks, benefits and alternatives for the proposed anesthesia with the patient or authorized representative who has indicated his/her understanding and acceptance.     Plan Discussed with:   Anesthesia Plan Comments:         Anesthesia Quick Evaluation

## 2012-11-10 NOTE — Op Note (Signed)
Patient:  Omar Taylor  DOB:  [birth year]  MRN:  [id number]   Preop Diagnosis:  Right inguinal hernia  Postop Diagnosis:  Same  Procedure:   Right inguinal herniorrhaphy  Surgeon:  Franky Macho, M.D.  Anes:  General  Indications:  Patient is a 67 year old white male who presents with a symptomatic right inguinal hernia. The risks and benefits of the procedure including bleeding, infection, pain, and recurrence of the hernia were fully explained to the patient, who gave informed consent.  Procedure note:  The patient is placed the supine position. After general anesthesia was symmetric, the right groin region was prepped and draped using the usual sterile technique with Betadine. Surgical site confirmation was performed.  A transverse incision was made in the right groin region down to the external oblique aponeuroses.  The  Aponeuroses was incised to the external ring. A Penrose drain was placed around the spermatic cord. The vas deferens was noted within the spermatic cord. The ilioinguinal nerve was identified and retracted inferiorly from the operative field. An indirect hernia sac was found. This was freed away from the spermatic cord up to the peritoneal reflection and inverted. A medium size PerFix plug was then placed into this region secured to the transversalis fascia using a 2-0 Novafil interrupted suture. An onlay patch was then placed along the floor of inguinal canal and secured superiorly to the conjoined tendon and inferiorly to the shelving edge of Poupart's ligament using 2-0 Novafil interrupted sutures. The internal ring was recreated using a 2-0 Novafil interrupted suture. The external oblique aponeuroses was reapproximated using a 2-0 Vicryl interrupted suture. The subcutaneous layer was reapproximated using 3-0 Vicryl interrupted suture. The skin was closed using a 4 Vicryl subcuticular suture. 0.5% Sensorcaine was instilled the surrounding wound. Dermabond was then  applied.  All tape and needle counts were correct at the end of the procedure. The patient was awakened and transferred to PACU in stable condition.  Complications:  None  EBL:  Minimal  Specimen:  None

## 2012-11-10 NOTE — Anesthesia Procedure Notes (Signed)
Procedure Name: LMA Insertion Date/Time: 11/10/2012 9:01 AM Performed by: Carolyne Littles, Jacquelynn Friend L Pre-anesthesia Checklist: Patient identified, Timeout performed, Emergency Drugs available, Suction available and Patient being monitored Patient Re-evaluated:Patient Re-evaluated prior to inductionOxygen Delivery Method: Circle system utilized Preoxygenation: Pre-oxygenation with 100% oxygen Intubation Type: IV induction Ventilation: Mask ventilation without difficulty LMA: LMA inserted LMA Size: 4.0 Number of attempts: 1 Placement Confirmation: positive ETCO2 and breath sounds checked- equal and bilateral Tube secured with: Tape Dental Injury: Teeth and Oropharynx as per pre-operative assessment

## 2012-11-10 NOTE — Interval H&P Note (Signed)
History and Physical Interval Note:  11/10/2012 8:25 AM  Omar Taylor  has presented today for surgery, with the diagnosis of right inguinal hernia  The various methods of treatment have been discussed with the patient and family. After consideration of risks, benefits and other options for treatment, the patient has consented to  Procedure(s): HERNIA REPAIR INGUINAL ADULT (Right) as a surgical intervention .  The patient's history has been reviewed, patient examined, no change in status, stable for surgery.  I have reviewed the patient's chart and labs.  Questions were answered to the patient's satisfaction.     Franky Macho A

## 2012-11-10 NOTE — Transfer of Care (Signed)
Immediate Anesthesia Transfer of Care Note  Patient: Omar Taylor  Procedure(s) Performed: Procedure(s): HERNIA REPAIR INGUINAL ADULT (Right)  Patient Location: PACU  Anesthesia Type:General  Level of Consciousness: awake, oriented and patient cooperative  Airway & Oxygen Therapy: Patient Spontanous Breathing and Patient connected to face mask oxygen  Post-op Assessment: Report given to PACU RN and Post -op Vital signs reviewed and stable  Post vital signs: Reviewed and stable  Complications: No apparent anesthesia complications

## 2012-11-10 NOTE — Anesthesia Postprocedure Evaluation (Signed)
  Anesthesia Post-op Note  Patient: Omar Taylor  Procedure(s) Performed: Procedure(s): HERNIA REPAIR INGUINAL ADULT (Right)  Patient Location: PACU  Anesthesia Type:General  Level of Consciousness: awake, oriented and patient cooperative  Airway and Oxygen Therapy: Patient Spontanous Breathing and Patient connected to face mask oxygen  Post-op Pain: none  Post-op Assessment: Post-op Vital signs reviewed, Patient's Cardiovascular Status Stable, Respiratory Function Stable, Patent Airway, No signs of Nausea or vomiting and Pain level controlled  Post-op Vital Signs: Reviewed and stable  Complications: No apparent anesthesia complications

## 2012-11-11 ENCOUNTER — Encounter (HOSPITAL_COMMUNITY): Payer: Self-pay | Admitting: General Surgery

## 2012-12-02 DIAGNOSIS — I10 Essential (primary) hypertension: Secondary | ICD-10-CM | POA: Diagnosis not present

## 2012-12-02 DIAGNOSIS — J019 Acute sinusitis, unspecified: Secondary | ICD-10-CM | POA: Diagnosis not present

## 2012-12-02 DIAGNOSIS — R05 Cough: Secondary | ICD-10-CM | POA: Diagnosis not present

## 2012-12-11 ENCOUNTER — Other Ambulatory Visit: Payer: Self-pay | Admitting: Family Medicine

## 2012-12-12 ENCOUNTER — Telehealth: Payer: Self-pay | Admitting: Family Medicine

## 2012-12-12 NOTE — Telephone Encounter (Signed)
error 

## 2013-03-10 ENCOUNTER — Ambulatory Visit (INDEPENDENT_AMBULATORY_CARE_PROVIDER_SITE_OTHER): Payer: Medicare Other | Admitting: Family Medicine

## 2013-03-10 ENCOUNTER — Encounter: Payer: Self-pay | Admitting: Family Medicine

## 2013-03-10 VITALS — BP 130/82 | Ht 74.0 in | Wt 221.8 lb

## 2013-03-10 DIAGNOSIS — I10 Essential (primary) hypertension: Secondary | ICD-10-CM | POA: Diagnosis not present

## 2013-03-10 DIAGNOSIS — R7301 Impaired fasting glucose: Secondary | ICD-10-CM

## 2013-03-10 DIAGNOSIS — E785 Hyperlipidemia, unspecified: Secondary | ICD-10-CM | POA: Diagnosis not present

## 2013-03-10 DIAGNOSIS — K4090 Unilateral inguinal hernia, without obstruction or gangrene, not specified as recurrent: Secondary | ICD-10-CM | POA: Diagnosis not present

## 2013-03-10 MED ORDER — ENALAPRIL MALEATE 5 MG PO TABS
5.0000 mg | ORAL_TABLET | Freq: Every day | ORAL | Status: DC
Start: 2013-03-10 — End: 2014-03-14

## 2013-03-10 NOTE — Progress Notes (Signed)
   Subjective:    Patient ID: Omar Taylor, male    DOB: 1945/11/10, 68 y.o.   MRN: [id number]  HPI  Patient arrives for a follow up on blood pressure. Patient reports no problems or concerns.  Feeling a lot better now post surgery  BP medicine stable. Blood pressure is good when checked elsewhere. Walking not as much his usual. Trying to watch salt intake. No obvious side effects her medicine.   Review of Systems No chest pain no back pain no abdominal pain no change in bowel habits no blood in stool ROS otherwise negative    Objective:   Physical Exam Alert no apparent distress blood pressure good on repeat 1 3480 lungs clear. Heart regular in rhythm. H&T normal.       Assessment & Plan:  Impression hypertension good control. #2 impaired fasting glucose discussed hyperlipidemia discussed plan diet exercise discussed. Recheck in 6 months. Meds refilled. Patient wishes to hold off on blood work for now. Will check when returns. WSL

## 2013-10-12 ENCOUNTER — Ambulatory Visit (INDEPENDENT_AMBULATORY_CARE_PROVIDER_SITE_OTHER): Payer: Medicare Other | Admitting: Family Medicine

## 2013-10-12 ENCOUNTER — Encounter: Payer: Self-pay | Admitting: Family Medicine

## 2013-10-12 VITALS — BP 124/80 | Ht 72.5 in | Wt 215.4 lb

## 2013-10-12 DIAGNOSIS — Z23 Encounter for immunization: Secondary | ICD-10-CM

## 2013-10-12 DIAGNOSIS — E782 Mixed hyperlipidemia: Secondary | ICD-10-CM

## 2013-10-12 DIAGNOSIS — Z125 Encounter for screening for malignant neoplasm of prostate: Secondary | ICD-10-CM

## 2013-10-12 DIAGNOSIS — Z79899 Other long term (current) drug therapy: Secondary | ICD-10-CM

## 2013-10-12 DIAGNOSIS — Z0000 Encounter for general adult medical examination without abnormal findings: Secondary | ICD-10-CM

## 2013-10-12 NOTE — Progress Notes (Signed)
   Subjective:    Patient ID: Omar Taylor, male    DOB: 07-24-1945, 68 y.o.   MRN: [id number]  HPI AWV- Annual Wellness Visit  The patient was seen for their annual wellness visit. The patient's past medical history, surgical history, and family history were reviewed. Pertinent vaccines were reviewed ( tetanus, pneumonia, shingles, flu) The patient's medication list was reviewed and updated.  The height and weight were entered. The patient's current BMI is:28.81  Cognitive screening was completed. Outcome of Mini - Cog: passed  Falls within the past 6 months:none  Current tobacco usage: non-smoker (All patients who use tobacco were given written and verbal information on quitting)  Recent listing of emergency department/hospitalizations over the past year were reviewed.  current specialist the patient sees on a regular basis: none   Medicare annual wellness visit patient questionnaire was reviewed.  A written screening schedule for the patient for the next 5-10 years was given. Appropriate discussion of followup regarding next visit was discussed.  Patient states he has no other concerns at this time.   Had flu last yr  Had sibling with colon cancer and died in hs 57 active and walks a lot,  No sig sob     Review of Systems  Constitutional: Negative for fever, activity change and appetite change.  HENT: Negative for congestion and rhinorrhea.   Eyes: Negative for discharge.  Respiratory: Negative for cough and wheezing.   Cardiovascular: Negative for chest pain.  Gastrointestinal: Negative for vomiting, abdominal pain and blood in stool.  Genitourinary: Negative for frequency and difficulty urinating.  Musculoskeletal: Negative for neck pain.       Chronic joint pain and inflammation from arthritis.  Skin: Negative for rash.  Allergic/Immunologic: Negative for environmental allergies and food allergies.  Neurological: Negative for weakness and  headaches.  Psychiatric/Behavioral: Negative for agitation.       Objective:   Physical Exam  Vitals reviewed. Constitutional: He appears well-developed and well-nourished.  HENT:  Head: Normocephalic and atraumatic.  Right Ear: External ear normal.  Left Ear: External ear normal.  Nose: Nose normal.  Mouth/Throat: Oropharynx is clear and moist.  Eyes: EOM are normal. Pupils are equal, round, and reactive to light.  Neck: Normal range of motion. Neck supple. No thyromegaly present.  Cardiovascular: Normal rate, regular rhythm and normal heart sounds.   No murmur heard. Pulmonary/Chest: Effort normal and breath sounds normal. No respiratory distress. He has no wheezes.  Abdominal: Soft. Bowel sounds are normal. He exhibits no distension and no mass. There is no tenderness.  Genitourinary: Penis normal.  Prostate slightly boggy but not tender. D  Musculoskeletal: Normal range of motion. He exhibits no edema.  Lymphadenopathy:    He has no cervical adenopathy.  Neurological: He is alert. He exhibits normal muscle tone.  Skin: Skin is warm and dry. No erythema.  Psychiatric: He has a normal mood and affect. His behavior is normal. Judgment normal.          Assessment & Plan:  Impression 1 wellness exam plan flu vaccine. Appropriate blood work. Check in 6 months. Diet and exercise discussed. Encouraged to get colonoscopy. She given. Flu shot given. Further recommendations based results. If PSA elevated will give trial of antibiotics before further intervention discussed. WSL

## 2013-10-13 DIAGNOSIS — Z0000 Encounter for general adult medical examination without abnormal findings: Secondary | ICD-10-CM | POA: Diagnosis not present

## 2013-10-13 DIAGNOSIS — E782 Mixed hyperlipidemia: Secondary | ICD-10-CM | POA: Diagnosis not present

## 2013-10-13 DIAGNOSIS — Z125 Encounter for screening for malignant neoplasm of prostate: Secondary | ICD-10-CM | POA: Diagnosis not present

## 2013-10-13 DIAGNOSIS — Z79899 Other long term (current) drug therapy: Secondary | ICD-10-CM | POA: Diagnosis not present

## 2013-10-13 LAB — LIPID PANEL
CHOL/HDL RATIO: 3.7 ratio
Cholesterol: 163 mg/dL (ref 0–200)
HDL: 44 mg/dL (ref >39)
LDL Cholesterol: 107 mg/dL — ABNORMAL HIGH (ref 0–99)
Triglycerides: 61 mg/dL (ref <150)
VLDL: 12 mg/dL (ref 0–40)

## 2013-10-13 LAB — BASIC METABOLIC PANEL
BUN: 19 mg/dL (ref 6–23)
CO2: 23 mEq/L (ref 19–32)
Calcium: 9.1 mg/dL (ref 8.4–10.5)
Chloride: 104 mEq/L (ref 96–112)
Creat: 0.73 mg/dL (ref 0.50–1.35)
GLUCOSE: 107 mg/dL — AB (ref 70–99)
POTASSIUM: 4.5 meq/L (ref 3.5–5.3)
Sodium: 134 mEq/L — ABNORMAL LOW (ref 135–145)

## 2013-10-13 LAB — HEPATIC FUNCTION PANEL
ALT: 25 U/L (ref 0–53)
AST: 17 U/L (ref 0–37)
Albumin: 4.4 g/dL (ref 3.5–5.2)
Alkaline Phosphatase: 102 U/L (ref 39–117)
BILIRUBIN DIRECT: 0.2 mg/dL (ref 0.0–0.3)
BILIRUBIN TOTAL: 0.7 mg/dL (ref 0.2–1.2)
Indirect Bilirubin: 0.5 mg/dL (ref 0.2–1.2)
Total Protein: 7.1 g/dL (ref 6.0–8.3)

## 2013-10-14 LAB — PSA: PSA: 2.93 ng/mL (ref <=4.00)

## 2013-10-18 ENCOUNTER — Encounter: Payer: Self-pay | Admitting: Family Medicine

## 2014-03-14 ENCOUNTER — Other Ambulatory Visit: Payer: Self-pay | Admitting: Family Medicine

## 2014-04-07 ENCOUNTER — Other Ambulatory Visit (INDEPENDENT_AMBULATORY_CARE_PROVIDER_SITE_OTHER): Payer: Self-pay | Admitting: *Deleted

## 2014-04-07 DIAGNOSIS — Z1211 Encounter for screening for malignant neoplasm of colon: Secondary | ICD-10-CM

## 2014-04-07 DIAGNOSIS — Z800 Family history of malignant neoplasm of digestive organs: Secondary | ICD-10-CM

## 2014-04-13 ENCOUNTER — Encounter: Payer: Self-pay | Admitting: Family Medicine

## 2014-04-13 ENCOUNTER — Ambulatory Visit (INDEPENDENT_AMBULATORY_CARE_PROVIDER_SITE_OTHER): Payer: Medicare Other | Admitting: Family Medicine

## 2014-04-13 VITALS — BP 130/80 | Ht 72.5 in | Wt 226.1 lb

## 2014-04-13 DIAGNOSIS — I10 Essential (primary) hypertension: Secondary | ICD-10-CM | POA: Diagnosis not present

## 2014-04-13 MED ORDER — ENALAPRIL MALEATE 5 MG PO TABS: 5.0000 mg | ORAL_TABLET | Freq: Every day | ORAL | Status: DC

## 2014-04-13 NOTE — Progress Notes (Signed)
   Subjective:    Patient ID: Omar Taylor, male    DOB: October 31, 1945, 69 y.o.   MRN: [id number]  Hypertension This is a chronic problem. The current episode started more than 1 year ago. The problem has been gradually improving since onset. The problem is controlled. There are no associated agents to hypertension. There are no known risk factors for coronary artery disease. Treatments tried: enalapril. The current treatment provides significant improvement. There are no compliance problems.    Patient states that he has no concerns at this time.   Walking and exdr [employer]  Sticking with bp meds faithfully   Numbers generally pretty good Review of Systems No vomiting no diarrhea no change in bowel habits    Objective:   Physical Exam  Alert blood pressure good on repeat. HEENT normal. Lungs clear. Heart regular in rhythm.      Assessment & Plan:  Impression 1 hypertension good control plan maintain same meds. Diet exercise discussed. Check in 6 months. WSL

## 2014-04-30 ENCOUNTER — Telehealth (INDEPENDENT_AMBULATORY_CARE_PROVIDER_SITE_OTHER): Payer: Self-pay | Admitting: *Deleted

## 2014-04-30 DIAGNOSIS — Z1211 Encounter for screening for malignant neoplasm of colon: Secondary | ICD-10-CM

## 2014-04-30 MED ORDER — PEG 3350-KCL-NA BICARB-NACL 420 G PO SOLR: 4000.0000 mL | Freq: Once | ORAL | Status: DC

## 2014-04-30 NOTE — Telephone Encounter (Signed)
Patient needs trilyte 

## 2014-05-18 ENCOUNTER — Telehealth (INDEPENDENT_AMBULATORY_CARE_PROVIDER_SITE_OTHER): Payer: Self-pay | Admitting: *Deleted

## 2014-05-18 NOTE — Telephone Encounter (Signed)
Referring MD/PCP: steve luking   Procedure: tcs  Reason/Indication:  Screening, fam hx colon ca  Has patient had this procedure before?  Yes, 10 yrs ago  If so, when, by whom and where?    Is there a family history of colon cancer?  Yes, brother  Who?  What age when diagnosed?    Is patient diabetic?   no      Does patient have prosthetic heart valve?  no  Do you have a pacemaker?  no  Has patient ever had endocarditis? no  Has patient had joint replacement within last 12 months?  no  Does patient tend to be constipated or take laxatives? no  Is patient on Coumadin, Plavix and/or Aspirin? yes  Medications: asa 81 mg daily, norvasc 5 mg daily  Allergies: nkda  Medication Adjustment: asa 2 days  Procedure date & time: 06/16/14 at 730

## 2014-05-18 NOTE — Telephone Encounter (Signed)
agree

## 2014-06-16 ENCOUNTER — Encounter (HOSPITAL_COMMUNITY)
Admission: RE | Disposition: A | Discharge status: Home / Self Care | Payer: Self-pay | Source: Ambulatory Visit | Attending: Internal Medicine

## 2014-06-16 ENCOUNTER — Encounter (HOSPITAL_COMMUNITY): Payer: Self-pay | Admitting: *Deleted

## 2014-06-16 ENCOUNTER — Ambulatory Visit (HOSPITAL_COMMUNITY)
Admission: RE | Admit: 2014-06-16 | Discharge: 2014-06-16 | Disposition: A | Discharge status: Home / Self Care | Payer: Medicare Other | Source: Ambulatory Visit | Attending: Internal Medicine | Admitting: Internal Medicine

## 2014-06-16 DIAGNOSIS — D123 Benign neoplasm of transverse colon: Secondary | ICD-10-CM | POA: Diagnosis not present

## 2014-06-16 DIAGNOSIS — M1990 Unspecified osteoarthritis, unspecified site: Secondary | ICD-10-CM | POA: Insufficient documentation

## 2014-06-16 DIAGNOSIS — K648 Other hemorrhoids: Secondary | ICD-10-CM | POA: Insufficient documentation

## 2014-06-16 DIAGNOSIS — K6289 Other specified diseases of anus and rectum: Secondary | ICD-10-CM | POA: Diagnosis not present

## 2014-06-16 DIAGNOSIS — Z800 Family history of malignant neoplasm of digestive organs: Secondary | ICD-10-CM | POA: Insufficient documentation

## 2014-06-16 DIAGNOSIS — D120 Benign neoplasm of cecum: Secondary | ICD-10-CM | POA: Diagnosis not present

## 2014-06-16 DIAGNOSIS — Z96643 Presence of artificial hip joint, bilateral: Secondary | ICD-10-CM | POA: Insufficient documentation

## 2014-06-16 DIAGNOSIS — K5730 Diverticulosis of large intestine without perforation or abscess without bleeding: Secondary | ICD-10-CM | POA: Diagnosis not present

## 2014-06-16 DIAGNOSIS — I10 Essential (primary) hypertension: Secondary | ICD-10-CM | POA: Insufficient documentation

## 2014-06-16 DIAGNOSIS — Z79899 Other long term (current) drug therapy: Secondary | ICD-10-CM | POA: Insufficient documentation

## 2014-06-16 DIAGNOSIS — Z1211 Encounter for screening for malignant neoplasm of colon: Secondary | ICD-10-CM | POA: Insufficient documentation

## 2014-06-16 HISTORY — PX: COLONOSCOPY: SHX5424

## 2014-06-16 SURGERY — COLONOSCOPY
Anesthesia: Moderate Sedation

## 2014-06-16 MED ORDER — MEPERIDINE HCL 50 MG/ML IJ SOLN
INTRAMUSCULAR | Status: DC | PRN
  Administered 2014-06-16 (×2): 25 mg via INTRAVENOUS

## 2014-06-16 MED ORDER — MIDAZOLAM HCL 5 MG/5ML IJ SOLN
INTRAMUSCULAR | Status: DC | PRN
  Administered 2014-06-16 (×3): 2 mg via INTRAVENOUS

## 2014-06-16 MED ORDER — SODIUM CHLORIDE 0.9 % IV SOLN
INTRAVENOUS | Status: DC
  Administered 2014-06-16: 07:00:00 via INTRAVENOUS

## 2014-06-16 MED ORDER — STERILE WATER FOR IRRIGATION IR SOLN
Status: DC | PRN
  Administered 2014-06-16: 07:00:00

## 2014-06-16 MED ORDER — MEPERIDINE HCL 50 MG/ML IJ SOLN
INTRAMUSCULAR | Status: AC
  Filled 2014-06-16: qty 1

## 2014-06-16 MED ORDER — MIDAZOLAM HCL 5 MG/5ML IJ SOLN
INTRAMUSCULAR | Status: AC
  Filled 2014-06-16: qty 10

## 2014-06-16 NOTE — Discharge Instructions (Signed)
Resume usual medications and high fiber diet. No driving for 24 hours. Physician will call with biopsy results. Diverticulitis Diverticulitis is inflammation or infection of small pouches in your colon that form when you have a condition called diverticulosis. The pouches in your colon are called diverticula. Your colon, or large intestine, is where water is absorbed and stool is formed. Complications of diverticulitis can include:  Bleeding.  Severe infection.  Severe pain.  Perforation of your colon.  Obstruction of your colon. CAUSES  Diverticulitis is caused by bacteria. Diverticulitis happens when stool becomes trapped in diverticula. This allows bacteria to grow in the diverticula, which can lead to inflammation and infection. RISK FACTORS People with diverticulosis are at risk for diverticulitis. Eating a diet that does not include enough fiber from fruits and vegetables may make diverticulitis more likely to develop. SYMPTOMS  Symptoms of diverticulitis may include:  Abdominal pain and tenderness. The pain is normally located on the left side of the abdomen, but may occur in other areas.  Fever and chills.  Bloating.  Cramping.  Nausea.  Vomiting.  Constipation.  Diarrhea.  Blood in your stool. DIAGNOSIS  Your health care provider will ask you about your medical history and do a physical exam. You may need to have tests done because many medical conditions can cause the same symptoms as diverticulitis. Tests may include:  Blood tests.  Urine tests.  Imaging tests of the abdomen, including X-rays and CT scans. When your condition is under control, your health care provider may recommend that you have a colonoscopy. A colonoscopy can show how severe your diverticula are and whether something else is causing your symptoms. TREATMENT  Most cases of diverticulitis are mild and can be treated at home. Treatment may include:  Taking over-the-counter pain  medicines.  Following a clear liquid diet.  Taking antibiotic medicines by mouth for 7-10 days. More severe cases may be treated at a hospital. Treatment may include:  Not eating or drinking.  Taking prescription pain medicine.  Receiving antibiotic medicines through an IV tube.  Receiving fluids and nutrition through an IV tube.  Surgery. HOME CARE INSTRUCTIONS   Follow your health care provider's instructions carefully.  Follow a full liquid diet or other diet as directed by your health care provider. After your symptoms improve, your health care provider may tell you to change your diet. He or she may recommend you eat a high-fiber diet. Fruits and vegetables are good sources of fiber. Fiber makes it easier to pass stool.  Take fiber supplements or probiotics as directed by your health care provider.  Only take medicines as directed by your health care provider.  Keep all your follow-up appointments. SEEK MEDICAL CARE IF:   Your pain does not improve.  You have a hard time eating food.  Your bowel movements do not return to normal. SEEK IMMEDIATE MEDICAL CARE IF:   Your pain becomes worse.  Your symptoms do not get better.  Your symptoms suddenly get worse.  You have a fever.  You have repeated vomiting.  You have bloody or black, tarry stools. MAKE SURE YOU:   Understand these instructions.  Will watch your condition.  Will get help right away if you are not doing well or get worse. Document Released: 10/25/2004 Document Revised: 01/20/2013 Document Reviewed: 12/10/2012 [facility] Patient Information 2015 [place], [place]. This information is not intended to replace advice given to you by your health care provider. Make sure you discuss any questions you have with  your health care provider.  Colon Polyps Polyps are lumps of extra tissue growing inside the body. Polyps can grow in the large intestine (colon). Most colon polyps are noncancerous (benign).  However, some colon polyps can become cancerous over time. Polyps that are larger than a pea may be harmful. To be safe, caregivers remove and test all polyps. CAUSES  Polyps form when mutations in the genes cause your cells to grow and divide even though no more tissue is needed. RISK FACTORS There are a number of risk factors that can increase your chances of getting colon polyps. They include:  Being older than 50 years.  Family history of colon polyps or colon can     Long-term colon diseases, such as colitis or Crohn disease.  Being overweight.  Smoking.  Being inactive.  Drinking too much alcohol. SYMPTOMS  Most small polyps do not cause symptoms. If symptoms are present, they may include:  Blood in the stool. The stool may look dark red or black.  Constipation or diarrhea that lasts longer than 1 week. DIAGNOSIS People often do not know they have polyps until their caregiver finds them during a regular checkup. Your caregiver can use 4 tests to check for polyps:  Digital rectal exam. The caregiver wears gloves and feels inside the rectum. This test would find polyps only in the rectum.  Barium enema. The caregiver puts a liquid called barium into your rectum before taking X-rays of your colon. Barium makes your colon look white. Polyps are dark, so they are easy to see in the X-ray pictures.  Sigmoidoscopy. A thin, flexible tube (sigmoidoscope) is placed into your rectum. The sigmoidoscope has a light and tiny camera in it. The caregiver uses the sigmoidoscope to look at the last third of your colon.  Colonoscopy. This test is like sigmoidoscopy, but the caregiver looks at the entire colon. This is the most common method for finding and removing polyps. TREATMENT  Any polyps will be removed during a sigmoidoscopy or colonoscopy. The polyps are then tested for cancer. PREVENTION  To help lower your risk of getting more colon polyps:  Eat plenty of fruits and  vegetables. Avoid eating fatty foods.  Do not smoke.  Avoid drinking alcohol.  Exercise every day.  Lose weight if recommended by your caregiver.  Eat plenty of calcium and folate. Foods that are rich in calcium include milk, cheese, and broccoli. Foods that are rich in folate include chickpeas, kidney beans, and spinach. HOME CARE INSTRUCTIONS Keep all follow-up appointments as directed by your caregiver. You may need periodic exams to check for polyps.   SEEK MEDICAL CARE IF: You notice bleeding during a bowel movement. Document Released: 10/12/2003 Document Revised: 04/09/2011 Document Reviewed: 03/27/2011 [facility] Patient Information 2015 [place], [place]. This information is not intended to replace advice given to you by your health care provider. Make sure you discuss any questions you have with your health care provider. Colonoscopy, Care After Refer to this sheet in the next few weeks. These instructions provide you with information on caring for yourself after your procedure. Your health care provider may also give you more specific instructions. Your treatment has been planned according to current medical practices, but problems sometimes occur. Call your health care provider if you have any problems or questions after your procedure. WHAT TO EXPECT AFTER THE PROCEDURE  After your procedure, it is typical to have the following:  A small amount of blood in your stool.  Moderate amounts of gas  and mild abdominal cramping or bloating. HOME CARE INSTRUCTIONS  Do not drive, operate machinery, or sign important documents for 24 hours.  You may shower and resume your regular physical activities, but move at a slower pace for the first 24 hours.  Take frequent rest periods for the first 24 hours.  Walk around or put a warm pack on your abdomen to help reduce abdominal cramping and bloating.  Drink enough fluids to keep your urine clear or pale yellow.  You may resume your  normal diet as instructed by your health care provider. Avoid heavy or fried foods that are hard to digest.  Avoid drinking alcohol for 24 hours or as instructed by your health care provider.  Only take over-the-counter or prescription medicines as directed by your health care provider.  If a tissue sample (biopsy) was taken during your procedure:  Do not take aspirin or blood thinners for 7 days, or as instructed by your health care provider.  Do not drink alcohol for 7 days, or as instructed by your health care provider.  Eat soft foods for the first 24 hours. SEEK MEDICAL CARE IF: You have persistent spotting of blood in your stool 2-3 days after the procedure. SEEK IMMEDIATE MEDICAL CARE IF:  You have more than a small spotting of blood in your stool.  You pass large blood clots in your stool.  Your abdomen is swollen (distended).  You have nausea or vomiting.  You have a fever.  You have increasing abdominal pain that is not relieved with medicine. Document Released: 08/30/2003 Document Revised: 11/05/2012 Document Reviewed: 09/22/2012 [facility] Patient Information 2015 [place], [place]. This information is not intended to replace advice given to you by your health care provider. Make sure you discuss any questions you have with your health care provider.

## 2014-06-16 NOTE — Op Note (Signed)
COLONOSCOPY PROCEDURE REPORT  PATIENT:  Omar Taylor  MR#:  [id number] Birthdate:  Feb 17, 1945, 69 y.o., male Endoscopist:  Dr. Rogene Houston, MD Referred By:  Dr. Grace Bushy. Wolfgang Phoenix, MD Procedure Date: 06/16/2014  Procedure:   Colonoscopy  Indications:  Patient is 69 year old Caucasian male who is undergoing screening colonoscopy. Family history significant for CRC and brother who died within few months of diagnosis at age 59. Patient's last colonoscopy was 10 years ago and was normal.  Informed Consent:  The procedure and risks were reviewed with the patient and informed consent was obtained.  Medications:  Demerol 50 mg IV Versed 6 mg IV  Description of procedure:  After a digital rectal exam was performed, that colonoscope was advanced from the anus through the rectum and colon to the area of the cecum, ileocecal valve and appendiceal orifice. The cecum was deeply intubated. These structures were well-seen and photographed for the record. From the level of the cecum and ileocecal valve, the scope was slowly and cautiously withdrawn. The mucosal surfaces were carefully surveyed utilizing scope tip to flexion to facilitate fold flattening as needed. The scope was pulled down into the rectum where a thorough exam including retroflexion was performed.  Findings:   Prep excellent. Small cecal polyp ablated via cold biopsy. Small polyp at proximal transverse colon also ablated via cold biopsy. Few diverticula at sigmoid colon. Normal rectal mucosa. Hemorrhoids above the dentate line and anorectal scar.   Therapeutic/Diagnostic Maneuvers Performed:  See above  Complications:  None  Cecal Withdrawal Time:  13 minutes  Impression:  Examination performed to cecum. Two small polyps ablated via cold biopsy and submitted separately. These are located at cecum and proximal transverse colon. Mild sigmoid colon diverticulosis. Internal hemorrhoid and scarring at dentate line from previous  hemorrhoidectomy.  Recommendations:  Standard instructions given. High fiber diet. I will contact patient with biopsy results and further recommendations.  Omar Taylor  06/16/2014 8:17 AM  CC: Dr. Mickie Hillier, MD & Dr. Rayne Du ref. provider found

## 2014-06-16 NOTE — H&P (Signed)
Omar Taylor is an 69 y.o. male.   Chief Complaint: Patient is here for colonoscopy. HPI: Patient is 41 old Caucasian male who is here for screening colonoscopy. He denies abdominal pain change in bowel habits or rectal bleeding. Last colonoscopy was 10 years ago. He was advised to come cardia but he's been postponing it. Family history significant for CRC in his brother who died at 49. Apparently he had symptoms for several months but did not seek medical advice.  Past Medical History  Diagnosis Date  . Hypertension   . Impaired fasting glucose   . Arthritis     Past Surgical History  Procedure Laterality Date  . Hemmorhoid  2007  . Appendectomy  1954  . Joint replacement Bilateral 2007    hip replacement, [facility]  . Inguinal hernia repair Right 11/10/2012    Procedure: HERNIA REPAIR INGUINAL ADULT;  Surgeon: Jamesetta So, MD;  Location: [facility];  Service: General;  Laterality: Right;  . Hernia repair  2014    History reviewed. No pertinent family history. Social History:  reports that he has never smoked. He does not have any smokeless tobacco history on file. His alcohol and drug histories are not on file.  Allergies: No Known Allergies  Medications Prior to Admission  Medication Sig Dispense Refill  . enalapril (VASOTEC) 5 MG tablet Take 1 tablet (5 mg total) by mouth daily. 90 tablet 1  . polyethylene glycol-electrolytes (NULYTELY/GOLYTELY) 420 G solution Take 4,000 mLs by mouth once. 4000 mL 0    No results found for this or any previous visit (from the past 48 hour(s)). No results found.  ROS  Blood pressure 135/83, pulse 66, temperature 97.4 F (36.3 C), resp. rate 15, SpO2 98 %. Physical Exam  Constitutional: He appears well-developed and well-nourished.  HENT:  Mouth/Throat: Oropharynx is clear and moist.  Eyes: Conjunctivae are normal. No scleral icterus.  Neck: No thyromegaly present.  Cardiovascular: Normal rate and regular rhythm.   No murmur  heard. Respiratory: Effort normal and breath sounds normal.  GI: Soft. He exhibits no distension and no mass. There is no tenderness.  Musculoskeletal: He exhibits no edema.  Lymphadenopathy:    He has no cervical adenopathy.  Neurological: He is alert.  Skin: Skin is warm and dry.     Assessment/Plan Average risk screening colonoscopy. History of CRC in brother who was in mid 39s at the time of diagnosis.  Jasmain Ahlberg U 06/16/2014, 7:36 AM

## 2014-06-17 ENCOUNTER — Encounter (HOSPITAL_COMMUNITY): Payer: Self-pay | Admitting: Internal Medicine

## 2014-06-21 ENCOUNTER — Encounter (INDEPENDENT_AMBULATORY_CARE_PROVIDER_SITE_OTHER): Payer: Self-pay | Admitting: *Deleted

## 2014-10-06 DIAGNOSIS — H1013 Acute atopic conjunctivitis, bilateral: Secondary | ICD-10-CM | POA: Diagnosis not present

## 2014-10-12 ENCOUNTER — Encounter: Payer: Medicare Other | Admitting: Family Medicine

## 2014-11-08 ENCOUNTER — Other Ambulatory Visit: Payer: Self-pay | Admitting: Family Medicine

## 2014-12-08 ENCOUNTER — Other Ambulatory Visit: Payer: Self-pay | Admitting: Family Medicine

## 2014-12-09 ENCOUNTER — Telehealth: Payer: Self-pay | Admitting: Family Medicine

## 2014-12-09 DIAGNOSIS — Z125 Encounter for screening for malignant neoplasm of prostate: Secondary | ICD-10-CM

## 2014-12-09 DIAGNOSIS — Z79899 Other long term (current) drug therapy: Secondary | ICD-10-CM

## 2014-12-09 DIAGNOSIS — E785 Hyperlipidemia, unspecified: Secondary | ICD-10-CM

## 2014-12-09 NOTE — Telephone Encounter (Signed)
Rep same 

## 2014-12-09 NOTE — Telephone Encounter (Signed)
Pt is requesting lab orders to be sent over. Last labs per epic were: lipid,hepatic,bmp,psa on 10/13/13

## 2014-12-10 NOTE — Telephone Encounter (Signed)
Notified wife that blood work has been ordered.  

## 2014-12-30 DIAGNOSIS — E785 Hyperlipidemia, unspecified: Secondary | ICD-10-CM | POA: Diagnosis not present

## 2014-12-30 DIAGNOSIS — Z125 Encounter for screening for malignant neoplasm of prostate: Secondary | ICD-10-CM | POA: Diagnosis not present

## 2014-12-30 DIAGNOSIS — Z79899 Other long term (current) drug therapy: Secondary | ICD-10-CM | POA: Diagnosis not present

## 2014-12-31 LAB — BASIC METABOLIC PANEL
BUN/Creatinine Ratio: 17 (ref 10–22)
BUN: 13 mg/dL (ref 8–27)
CHLORIDE: 100 mmol/L (ref 97–106)
CO2: 24 mmol/L (ref 18–29)
CREATININE: 0.77 mg/dL (ref 0.76–1.27)
Calcium: 9.3 mg/dL (ref 8.6–10.2)
GFR calc Af Amer: 109 mL/min/{1.73_m2} (ref >59)
GFR calc non Af Amer: 94 mL/min/{1.73_m2} (ref >59)
GLUCOSE: 127 mg/dL — AB (ref 65–99)
Potassium: 5 mmol/L (ref 3.5–5.2)
Sodium: 138 mmol/L (ref 136–144)

## 2014-12-31 LAB — HEPATIC FUNCTION PANEL
ALK PHOS: 114 IU/L (ref 39–117)
ALT: 20 IU/L (ref 0–44)
AST: 18 IU/L (ref 0–40)
Albumin: 4.1 g/dL (ref 3.6–4.8)
Bilirubin Total: 0.3 mg/dL (ref 0.0–1.2)
Bilirubin, Direct: 0.1 mg/dL (ref 0.00–0.40)
TOTAL PROTEIN: 7.2 g/dL (ref 6.0–8.5)

## 2014-12-31 LAB — PSA: Prostate Specific Ag, Serum: 2.1 ng/mL (ref 0.0–4.0)

## 2014-12-31 LAB — LIPID PANEL
Chol/HDL Ratio: 4.1 ratio units (ref 0.0–5.0)
Cholesterol, Total: 186 mg/dL (ref 100–199)
HDL: 45 mg/dL (ref >39)
LDL Calculated: 128 mg/dL — ABNORMAL HIGH (ref 0–99)
Triglycerides: 66 mg/dL (ref 0–149)
VLDL CHOLESTEROL CAL: 13 mg/dL (ref 5–40)

## 2015-01-10 ENCOUNTER — Ambulatory Visit (INDEPENDENT_AMBULATORY_CARE_PROVIDER_SITE_OTHER): Payer: Medicare Other | Admitting: Family Medicine

## 2015-01-10 ENCOUNTER — Other Ambulatory Visit: Payer: Self-pay | Admitting: Family Medicine

## 2015-01-10 ENCOUNTER — Encounter: Payer: Self-pay | Admitting: Family Medicine

## 2015-01-10 VITALS — BP 130/84 | Ht 72.0 in | Wt 217.2 lb

## 2015-01-10 DIAGNOSIS — Z23 Encounter for immunization: Secondary | ICD-10-CM

## 2015-01-10 DIAGNOSIS — I10 Essential (primary) hypertension: Secondary | ICD-10-CM | POA: Diagnosis not present

## 2015-01-10 DIAGNOSIS — Z0000 Encounter for general adult medical examination without abnormal findings: Secondary | ICD-10-CM | POA: Diagnosis not present

## 2015-01-10 DIAGNOSIS — R7301 Impaired fasting glucose: Secondary | ICD-10-CM | POA: Diagnosis not present

## 2015-01-10 MED ORDER — ENALAPRIL MALEATE 5 MG PO TABS: 5.0000 mg | ORAL_TABLET | Freq: Every day | ORAL | Status: DC

## 2015-01-10 NOTE — Progress Notes (Signed)
Subjective:    Patient ID: Omar Taylor, male    DOB: 1945-08-08, 69 y.o.   MRN: [id number]  HPI The patient comes in today for a wellness visit.    A review of their health history was completed.  A review of medications was also completed.  Any needed refills; yes  Eating habits: good  Falls/  MVA accidents in past few months: none  Regular exercise: yes- active  Specialist pt sees on regular basis: no  Preventative health issues were discussed.   Additional concerns: none   BP med compliance good, b p excellent when cked elsewhere. Medications reviewed today. Generally does not miss a dose. No obvious side effects. Watching salt intake. Walking some  Flu shot due   Watching diet closely  Results for orders placed or performed in visit on 12/09/14  Lipid panel  Result Value Ref Range   Cholesterol, Total 186 100 - 199 mg/dL   Triglycerides 66 0 - 149 mg/dL   HDL 45 >39 mg/dL   VLDL Cholesterol Cal 13 5 - 40 mg/dL   LDL Calculated 128 (H) 0 - 99 mg/dL   Chol/HDL Ratio 4.1 0.0 - 5.0 ratio units  Hepatic function panel  Result Value Ref Range   Total Protein 7.2 6.0 - 8.5 g/dL   Albumin 4.1 3.6 - 4.8 g/dL   Bilirubin Total 0.3 0.0 - 1.2 mg/dL   Bilirubin, Direct 0.10 0.00 - 0.40 mg/dL   Alkaline Phosphatase 114 39 - 117 IU/L   AST 18 0 - 40 IU/L   ALT 20 0 - 44 IU/L  Basic metabolic panel  Result Value Ref Range   Glucose 127 (H) 65 - 99 mg/dL   BUN 13 8 - 27 mg/dL   Creatinine, Ser 0.77 0.76 - 1.27 mg/dL   GFR calc non Af Amer 94 >59 mL/min/1.73   GFR calc Af Amer 109 >59 mL/min/1.73   BUN/Creatinine Ratio 17 10 - 22   Sodium 138 136 - 144 mmol/L   Potassium 5.0 3.5 - 5.2 mmol/L   Chloride 100 97 - 106 mmol/L   CO2 24 18 - 29 mmol/L   Calcium 9.3 8.6 - 10.2 mg/dL  PSA  Result Value Ref Range   Prostate Specific Ag, Serum 2.1 0.0 - 4.0 ng/mL     Review of Systems  Constitutional: Negative for fever, activity change and appetite change.    HENT: Negative for congestion and rhinorrhea.   Eyes: Negative for discharge.  Respiratory: Negative for cough and wheezing.   Cardiovascular: Negative for chest pain.  Gastrointestinal: Negative for vomiting, abdominal pain and blood in stool.  Genitourinary: Negative for frequency and difficulty urinating.  Musculoskeletal: Negative for neck pain.  Skin: Negative for rash.  Allergic/Immunologic: Negative for environmental allergies and food allergies.  Neurological: Negative for weakness and headaches.  Psychiatric/Behavioral: Negative for agitation.  All other systems reviewed and are negative.      Objective:   Physical Exam  Constitutional: He appears well-developed and well-nourished.  HENT:  Head: Normocephalic and atraumatic.  Right Ear: External ear normal.  Left Ear: External ear normal.  Nose: Nose normal.  Mouth/Throat: Oropharynx is clear and moist.  Eyes: EOM are normal. Pupils are equal, round, and reactive to light.  Neck: Normal range of motion. Neck supple. No thyromegaly present.  Cardiovascular: Normal rate, regular rhythm and normal heart sounds.   No murmur heard. Pulmonary/Chest: Effort normal and breath sounds normal. No respiratory distress. He has no wheezes.  Abdominal: Soft. Bowel sounds are normal. He exhibits no distension and no mass. There is no tenderness.  Genitourinary: Penis normal.  Prostate within normal limits  Musculoskeletal: Normal range of motion. He exhibits no edema.  Lymphadenopathy:    He has no cervical adenopathy.  Neurological: He is alert. He exhibits normal muscle tone.  Skin: Skin is warm and dry. No erythema.  Psychiatric: He has a normal mood and affect. His behavior is normal. Judgment normal.  Vitals reviewed.         Assessment & Plan:  Imp wellness exam 2 htn good control , meds reviewed, cmompliance disc  Plan.  approp vaccines, diet ex, disc, appropriate blood work reviewed.elev glucose and elev ldl disc, pt  to wrk on diet WSL

## 2015-07-08 ENCOUNTER — Encounter: Payer: Self-pay | Admitting: Family Medicine

## 2015-07-08 ENCOUNTER — Ambulatory Visit (INDEPENDENT_AMBULATORY_CARE_PROVIDER_SITE_OTHER): Payer: Medicare Other | Admitting: Family Medicine

## 2015-07-08 VITALS — BP 132/78 | Ht 72.0 in | Wt 217.0 lb

## 2015-07-08 DIAGNOSIS — I10 Essential (primary) hypertension: Secondary | ICD-10-CM

## 2015-07-08 MED ORDER — ENALAPRIL MALEATE 5 MG PO TABS: 5.0000 mg | ORAL_TABLET | Freq: Every day | ORAL | Status: DC

## 2015-07-08 NOTE — Progress Notes (Signed)
   Subjective:    Patient ID: Omar Taylor, male    DOB: 1945/09/27, 70 y.o.   MRN: [id number]  Hypertension This is a chronic problem. The current episode started more than 1 year ago. Treatments tried: enalapril 5mg . There are no compliance problems (exercises every day, eats healthy).   cked elsewhere is good at [facility] three wks ago  Staying active walking nd exrercise Pt states no concerns today.   Trying to eat better and walking better    Review of Systems No headache, no major weight loss or weight gain, no chest pain no back pain abdominal pain no change in bowel habits complete ROS otherwise negative     Objective:   Physical Exam  Alert vitals stable blood pressure excellent on repeat HEENT normal lungs clear heart regular in rhythm      Assessment & Plan:  Impression 1 hypertension good control maintain same dose plan 6 months prescriptions blood work already done [employer]

## 2015-09-12 ENCOUNTER — Other Ambulatory Visit: Payer: Self-pay

## 2015-09-12 MED ORDER — ENALAPRIL MALEATE 5 MG PO TABS: 5.0000 mg | ORAL_TABLET | Freq: Every day | ORAL | 0 refills | Status: DC

## 2016-01-02 ENCOUNTER — Other Ambulatory Visit: Payer: Self-pay | Admitting: Family Medicine

## 2016-01-06 ENCOUNTER — Telehealth: Payer: Self-pay | Admitting: Family Medicine

## 2016-01-06 DIAGNOSIS — Z0000 Encounter for general adult medical examination without abnormal findings: Secondary | ICD-10-CM

## 2016-01-06 NOTE — Telephone Encounter (Signed)
Spoke with patient's wife and informed her per Dr.Scott Luking- Patient's labs have been ordered. Patient's wife verbalized understanding.

## 2016-01-06 NOTE — Telephone Encounter (Signed)
Lipid, liver, metabolic 7, PSA, CBC

## 2016-01-06 NOTE — Telephone Encounter (Signed)
Pt is requesting lab orders to be sent over for an upcoming wellness visit on Wednesday. Pt is wanting to go on Monday to get his lab work. Last labs per epic were: psa,bmp,hepatic,and lipid on 12/30/14.

## 2016-01-09 DIAGNOSIS — Z0000 Encounter for general adult medical examination without abnormal findings: Secondary | ICD-10-CM | POA: Diagnosis not present

## 2016-01-09 DIAGNOSIS — E785 Hyperlipidemia, unspecified: Secondary | ICD-10-CM | POA: Diagnosis not present

## 2016-01-09 DIAGNOSIS — R5383 Other fatigue: Secondary | ICD-10-CM | POA: Diagnosis not present

## 2016-01-09 DIAGNOSIS — Z125 Encounter for screening for malignant neoplasm of prostate: Secondary | ICD-10-CM | POA: Diagnosis not present

## 2016-01-10 LAB — BASIC METABOLIC PANEL
BUN / CREAT RATIO: 24 (ref 10–24)
BUN: 20 mg/dL (ref 8–27)
CO2: 23 mmol/L (ref 18–29)
Calcium: 9.9 mg/dL (ref 8.6–10.2)
Chloride: 100 mmol/L (ref 96–106)
Creatinine, Ser: 0.83 mg/dL (ref 0.76–1.27)
GFR calc Af Amer: 105 mL/min/{1.73_m2} (ref >59)
GFR calc non Af Amer: 90 mL/min/{1.73_m2} (ref >59)
Glucose: 128 mg/dL — ABNORMAL HIGH (ref 65–99)
Potassium: 5 mmol/L (ref 3.5–5.2)
SODIUM: 139 mmol/L (ref 134–144)

## 2016-01-10 LAB — CBC WITH DIFFERENTIAL/PLATELET
BASOS ABS: 0 10*3/uL (ref 0.0–0.2)
Basos: 0 %
EOS (ABSOLUTE): 0.2 10*3/uL (ref 0.0–0.4)
Eos: 3 %
Hematocrit: 45.1 % (ref 37.5–51.0)
Hemoglobin: 15.5 g/dL (ref 13.0–17.7)
Immature Grans (Abs): 0 10*3/uL (ref 0.0–0.1)
Immature Granulocytes: 0 %
LYMPHS ABS: 1.7 10*3/uL (ref 0.7–3.1)
Lymphs: 25 %
MCH: 30.5 pg (ref 26.6–33.0)
MCHC: 34.4 g/dL (ref 31.5–35.7)
MCV: 89 fL (ref 79–97)
MONOS ABS: 0.6 10*3/uL (ref 0.1–0.9)
Monocytes: 8 %
NEUTROS ABS: 4.2 10*3/uL (ref 1.4–7.0)
Neutrophils: 64 %
Platelets: 243 10*3/uL (ref 150–379)
RBC: 5.09 x10E6/uL (ref 4.14–5.80)
RDW: 13.5 % (ref 12.3–15.4)
WBC: 6.7 10*3/uL (ref 3.4–10.8)

## 2016-01-10 LAB — LIPID PANEL
CHOL/HDL RATIO: 4 ratio (ref 0.0–5.0)
Cholesterol, Total: 186 mg/dL (ref 100–199)
HDL: 47 mg/dL (ref >39)
LDL Calculated: 129 mg/dL — ABNORMAL HIGH (ref 0–99)
TRIGLYCERIDES: 52 mg/dL (ref 0–149)
VLDL CHOLESTEROL CAL: 10 mg/dL (ref 5–40)

## 2016-01-10 LAB — HEPATIC FUNCTION PANEL
ALT: 21 IU/L (ref 0–44)
AST: 19 IU/L (ref 0–40)
Albumin: 4.5 g/dL (ref 3.6–4.8)
Alkaline Phosphatase: 105 IU/L (ref 39–117)
BILIRUBIN, DIRECT: 0.12 mg/dL (ref 0.00–0.40)
Bilirubin Total: 0.5 mg/dL (ref 0.0–1.2)
Total Protein: 7.4 g/dL (ref 6.0–8.5)

## 2016-01-10 LAB — PSA: Prostate Specific Ag, Serum: 2 ng/mL (ref 0.0–4.0)

## 2016-01-11 ENCOUNTER — Encounter: Payer: Self-pay | Admitting: Family Medicine

## 2016-01-11 ENCOUNTER — Ambulatory Visit (INDEPENDENT_AMBULATORY_CARE_PROVIDER_SITE_OTHER): Payer: Medicare Other | Admitting: Family Medicine

## 2016-01-11 VITALS — BP 130/86 | Ht 72.0 in | Wt 220.8 lb

## 2016-01-11 DIAGNOSIS — R7301 Impaired fasting glucose: Secondary | ICD-10-CM

## 2016-01-11 DIAGNOSIS — Z0000 Encounter for general adult medical examination without abnormal findings: Secondary | ICD-10-CM

## 2016-01-11 DIAGNOSIS — R7303 Prediabetes: Secondary | ICD-10-CM | POA: Diagnosis not present

## 2016-01-11 DIAGNOSIS — I10 Essential (primary) hypertension: Secondary | ICD-10-CM | POA: Diagnosis not present

## 2016-01-11 DIAGNOSIS — Z23 Encounter for immunization: Secondary | ICD-10-CM

## 2016-01-11 LAB — POCT GLYCOSYLATED HEMOGLOBIN (HGB A1C): Hemoglobin A1C: 5.8

## 2016-01-11 MED ORDER — ENALAPRIL MALEATE 5 MG PO TABS: 5.0000 mg | ORAL_TABLET | Freq: Every day | ORAL | 1 refills | Status: DC

## 2016-01-11 NOTE — Progress Notes (Signed)
Subjective:    Patient ID: Omar Taylor, male    DOB: [birth year], 70 y.o.   MRN: [id number]  HPI AWV- Annual Wellness Visit  The patient was seen for their annual wellness visit. The patient's past medical history, surgical history, and family history were reviewed. Pertinent vaccines were reviewed ( tetanus, pneumonia, shingles, flu) The patient's medication list was reviewed and updated.  The height and weight were entered. The patient's current BMI is:30  Cognitive screening was completed. Outcome of Mini - Cog: pass  Falls within the past 6 months:none  Current tobacco usage: none (All patients who use tobacco were given written and verbal information on quitting)  Recent listing of emergency department/hospitalizations over the past year were reviewed.  current specialist the patient sees on a regular basis: no   Medicare annual wellness visit patient questionnaire was reviewed.  A written screening schedule for the patient for the next 5-10 years was given. Appropriate discussion of followup regarding next visit was discussed.  Results for orders placed or performed in visit on 01/06/16  Lipid panel  Result Value Ref Range   Cholesterol, Total 186 100 - 199 mg/dL   Triglycerides 52 0 - 149 mg/dL   HDL 47 >39 mg/dL   VLDL Cholesterol Cal 10 5 - 40 mg/dL   LDL Calculated 129 (H) 0 - 99 mg/dL   Chol/HDL Ratio 4.0 0.0 - 5.0 ratio units  Hepatic function panel  Result Value Ref Range   Total Protein 7.4 6.0 - 8.5 g/dL   Albumin 4.5 3.6 - 4.8 g/dL   Bilirubin Total 0.5 0.0 - 1.2 mg/dL   Bilirubin, Direct 0.12 0.00 - 0.40 mg/dL   Alkaline Phosphatase 105 39 - 117 IU/L   AST 19 0 - 40 IU/L   ALT 21 0 - 44 IU/L  Basic metabolic panel  Result Value Ref Range   Glucose 128 (H) 65 - 99 mg/dL   BUN 20 8 - 27 mg/dL   Creatinine, Ser 0.83 0.76 - 1.27 mg/dL   GFR calc non Af Amer 90 >59 mL/min/1.73   GFR calc Af Amer 105 >59 mL/min/1.73   BUN/Creatinine Ratio 24 10 -  24   Sodium 139 134 - 144 mmol/L   Potassium 5.0 3.5 - 5.2 mmol/L   Chloride 100 96 - 106 mmol/L   CO2 23 18 - 29 mmol/L   Calcium 9.9 8.6 - 10.2 mg/dL  PSA  Result Value Ref Range   Prostate Specific Ag, Serum 2.0 0.0 - 4.0 ng/mL  CBC with Differential/Platelet  Result Value Ref Range   WBC 6.7 3.4 - 10.8 x10E3/uL   RBC 5.09 4.14 - 5.80 x10E6/uL   Hemoglobin 15.5 13.0 - 17.7 g/dL   Hematocrit 45.1 37.5 - 51.0 %   MCV 89 79 - 97 fL   MCH 30.5 26.6 - 33.0 pg   MCHC 34.4 31.5 - 35.7 g/dL   RDW 13.5 12.3 - 15.4 %   Platelets 243 150 - 379 x10E3/uL   Neutrophils 64 Not Estab. %   Lymphs 25 Not Estab. %   Monocytes 8 Not Estab. %   Eos 3 Not Estab. %   Basos 0 Not Estab. %   Neutrophils Absolute 4.2 1.4 - 7.0 x10E3/uL   Lymphocytes Absolute 1.7 0.7 - 3.1 x10E3/uL   Monocytes Absolute 0.6 0.1 - 0.9 x10E3/uL   EOS (ABSOLUTE) 0.2 0.0 - 0.4 x10E3/uL   Basophils Absolute 0.0 0.0 - 0.2 x10E3/uL   Immature Granulocytes 0  Not Estab. %   Immature Grans (Abs) 0.0 0.0 - 0.1 x10E3/uL        Review of Systems  Constitutional: Negative for activity change, appetite change and fever.  HENT: Negative for congestion and rhinorrhea.   Eyes: Negative for discharge.  Respiratory: Negative for cough and wheezing.   Cardiovascular: Negative for chest pain.  Gastrointestinal: Negative for abdominal pain, blood in stool and vomiting.  Genitourinary: Negative for difficulty urinating and frequency.  Musculoskeletal: Negative for neck pain.  Skin: Negative for rash.  Allergic/Immunologic: Negative for environmental allergies and food allergies.  Neurological: Negative for weakness and headaches.  Psychiatric/Behavioral: Negative for agitation.  All other systems reviewed and are negative.      Objective:   Physical Exam  Constitutional: He appears well-developed and well-nourished.  HENT:  Head: Normocephalic and atraumatic.  Right Ear: External ear normal.  Left Ear: External ear  normal.  Nose: Nose normal.  Mouth/Throat: Oropharynx is clear and moist.  Eyes: EOM are normal. Pupils are equal, round, and reactive to light.  Neck: Normal range of motion. Neck supple. No thyromegaly present.  Cardiovascular: Normal rate, regular rhythm and normal heart sounds.   No murmur heard. Pulmonary/Chest: Effort normal and breath sounds normal. No respiratory distress. He has no wheezes.  Abdominal: Soft. Bowel sounds are normal. He exhibits no distension and no mass. There is no tenderness.  Genitourinary: Penis normal.  Musculoskeletal: Normal range of motion. He exhibits no edema.  Lymphadenopathy:    He has no cervical adenopathy.  Neurological: He is alert. He exhibits normal muscle tone.  Skin: Skin is warm and dry. No erythema.  Psychiatric: He has a normal mood and affect. His behavior is normal. Judgment normal.  Vitals reviewed.  Results for orders placed or performed in visit on 01/11/16  POCT glycosylated hemoglobin (Hb A1C)  Result Value Ref Range   Hemoglobin A1C 5.8           Assessment & Plan:  Impression #1 wellness exam up-to-date on colonoscopy diet exercise discussed #2 hypertension good control discussed maintain same dose of medication. #3 prediabetes. Fasting sugar was 127. Follow-up A1c reveals prediabetes not true diabetes yet generally good news. Dietary measures exercise weight loss all discussed follow-up in 6 months

## 2016-01-11 NOTE — Patient Instructions (Signed)
Results for orders placed or performed in visit on 01/06/16  Lipid panel  Result Value Ref Range   Cholesterol, Total 186 100 - 199 mg/dL   Triglycerides 52 0 - 149 mg/dL   HDL 47 >39 mg/dL   VLDL Cholesterol Cal 10 5 - 40 mg/dL   LDL Calculated 129 (H) 0 - 99 mg/dL   Chol/HDL Ratio 4.0 0.0 - 5.0 ratio units  Hepatic function panel  Result Value Ref Range   Total Protein 7.4 6.0 - 8.5 g/dL   Albumin 4.5 3.6 - 4.8 g/dL   Bilirubin Total 0.5 0.0 - 1.2 mg/dL   Bilirubin, Direct 0.12 0.00 - 0.40 mg/dL   Alkaline Phosphatase 105 39 - 117 IU/L   AST 19 0 - 40 IU/L   ALT 21 0 - 44 IU/L  Basic metabolic panel  Result Value Ref Range   Glucose 128 (H) 65 - 99 mg/dL   BUN 20 8 - 27 mg/dL   Creatinine, Ser 0.83 0.76 - 1.27 mg/dL   GFR calc non Af Amer 90 >59 mL/min/1.73   GFR calc Af Amer 105 >59 mL/min/1.73   BUN/Creatinine Ratio 24 10 - 24   Sodium 139 134 - 144 mmol/L   Potassium 5.0 3.5 - 5.2 mmol/L   Chloride 100 96 - 106 mmol/L   CO2 23 18 - 29 mmol/L   Calcium 9.9 8.6 - 10.2 mg/dL  PSA  Result Value Ref Range   Prostate Specific Ag, Serum 2.0 0.0 - 4.0 ng/mL  CBC with Differential/Platelet  Result Value Ref Range   WBC 6.7 3.4 - 10.8 x10E3/uL   RBC 5.09 4.14 - 5.80 x10E6/uL   Hemoglobin 15.5 13.0 - 17.7 g/dL   Hematocrit 45.1 37.5 - 51.0 %   MCV 89 79 - 97 fL   MCH 30.5 26.6 - 33.0 pg   MCHC 34.4 31.5 - 35.7 g/dL   RDW 13.5 12.3 - 15.4 %   Platelets 243 150 - 379 x10E3/uL   Neutrophils 64 Not Estab. %   Lymphs 25 Not Estab. %   Monocytes 8 Not Estab. %   Eos 3 Not Estab. %   Basos 0 Not Estab. %   Neutrophils Absolute 4.2 1.4 - 7.0 x10E3/uL   Lymphocytes Absolute 1.7 0.7 - 3.1 x10E3/uL   Monocytes Absolute 0.6 0.1 - 0.9 x10E3/uL   EOS (ABSOLUTE) 0.2 0.0 - 0.4 x10E3/uL   Basophils Absolute 0.0 0.0 - 0.2 x10E3/uL   Immature Granulocytes 0 Not Estab. %   Immature Grans (Abs) 0.0 0.0 - 0.1 x10E3/uL

## 2016-07-10 ENCOUNTER — Ambulatory Visit (INDEPENDENT_AMBULATORY_CARE_PROVIDER_SITE_OTHER): Payer: Medicare Other | Admitting: Family Medicine

## 2016-07-10 ENCOUNTER — Encounter: Payer: Self-pay | Admitting: Family Medicine

## 2016-07-10 VITALS — BP 152/88 | Ht 72.0 in | Wt 207.6 lb

## 2016-07-10 DIAGNOSIS — I10 Essential (primary) hypertension: Secondary | ICD-10-CM | POA: Diagnosis not present

## 2016-07-10 DIAGNOSIS — R7301 Impaired fasting glucose: Secondary | ICD-10-CM | POA: Diagnosis not present

## 2016-07-10 DIAGNOSIS — R7303 Prediabetes: Secondary | ICD-10-CM | POA: Diagnosis not present

## 2016-07-10 LAB — POCT GLYCOSYLATED HEMOGLOBIN (HGB A1C): Hemoglobin A1C: 6.1

## 2016-07-10 MED ORDER — ENALAPRIL MALEATE 5 MG PO TABS: 5.0000 mg | ORAL_TABLET | Freq: Every day | ORAL | 1 refills | Status: DC

## 2016-07-10 NOTE — Progress Notes (Signed)
   Subjective:    Patient ID: Omar Taylor, male    DOB: Dec 29, 1945, 71 y.o.   MRN: [id number]  Hypertension  This is a chronic problem. There are no compliance problems (eats healthy, exercises, takes med every day.).    Blood pressure medicine and blood pressure levels reviewed today with patient. Compliant with blood pressure medicine. States does not miss a dose. No obvious side effects. Blood pressure generally good when checked elsewhere. Watching salt intake.   bp often very good  Usually pretty good   Impaired fasting glucose. Pt concerned about blood sugar.  Results for orders placed or performed in visit on 07/10/16  POCT glycosylated hemoglobin (Hb A1C)  Result Value Ref Range   Hemoglobin A1C 6.1    Patient aware he has prediabetes. Work on diet. Has lost 15 pounds positive family history diabetes. Exercising more.  Review of Systems No headache, no major weight loss or weight gain, no chest pain no back pain abdominal pain no change in bowel habits complete ROS otherwise negative     Objective:   Physical Exam Alert and oriented, vitals reviewed and stable, NAD ENT-TM's and ext canals WNL bilat via otoscopic exam Soft palate, tonsils and post pharynx WNL via oropharyngeal exam Neck-symmetric, no masses; thyroid nonpalpable and nontender Pulmonary-no tachypnea or accessory muscle use; Clear without wheezes via auscultation Card--no abnrml murmurs, rhythm reg and rate WNL Carotid pulses symmetric, without bruits        Assessment & Plan:  Impression 1 hypertension good control discussed maintain same #2 prediabetes long discussion held patient educated multiple questions answered. A1c somewhat worse but still nondiabetic  Greater than 50% of this 25 minute face to face visit was spent in counseling and discussion and coordination of care regarding the above diagnosis/diagnosies

## 2017-01-03 ENCOUNTER — Other Ambulatory Visit: Payer: Self-pay

## 2017-01-09 ENCOUNTER — Telehealth: Payer: Self-pay | Admitting: Family Medicine

## 2017-01-09 DIAGNOSIS — Z13220 Encounter for screening for lipoid disorders: Secondary | ICD-10-CM

## 2017-01-09 DIAGNOSIS — Z125 Encounter for screening for malignant neoplasm of prostate: Secondary | ICD-10-CM

## 2017-01-09 DIAGNOSIS — R5383 Other fatigue: Secondary | ICD-10-CM

## 2017-01-09 DIAGNOSIS — I10 Essential (primary) hypertension: Secondary | ICD-10-CM

## 2017-01-09 DIAGNOSIS — Z79899 Other long term (current) drug therapy: Secondary | ICD-10-CM

## 2017-01-09 NOTE — Telephone Encounter (Signed)
Last had CBC,PSA,BMET,Hepatic fx panel,Lipid on 01/09/2016. Please advise.

## 2017-01-09 NOTE — Telephone Encounter (Signed)
Patient has 29mth followup on 12/14 and was wondering if he needs labs done.

## 2017-01-10 NOTE — Telephone Encounter (Signed)
Rep same 

## 2017-01-10 NOTE — Telephone Encounter (Signed)
Pt is aware and the orders are in.

## 2017-01-11 ENCOUNTER — Ambulatory Visit (INDEPENDENT_AMBULATORY_CARE_PROVIDER_SITE_OTHER): Payer: Medicare Other | Admitting: Family Medicine

## 2017-01-11 ENCOUNTER — Encounter: Payer: Self-pay | Admitting: Family Medicine

## 2017-01-11 VITALS — BP 140/86 | Ht 72.0 in | Wt 211.6 lb

## 2017-01-11 DIAGNOSIS — Z23 Encounter for immunization: Secondary | ICD-10-CM | POA: Diagnosis not present

## 2017-01-11 DIAGNOSIS — Z0000 Encounter for general adult medical examination without abnormal findings: Secondary | ICD-10-CM | POA: Diagnosis not present

## 2017-01-11 DIAGNOSIS — Z125 Encounter for screening for malignant neoplasm of prostate: Secondary | ICD-10-CM | POA: Diagnosis not present

## 2017-01-11 DIAGNOSIS — Z79899 Other long term (current) drug therapy: Secondary | ICD-10-CM | POA: Diagnosis not present

## 2017-01-11 DIAGNOSIS — I10 Essential (primary) hypertension: Secondary | ICD-10-CM

## 2017-01-11 DIAGNOSIS — Z13220 Encounter for screening for lipoid disorders: Secondary | ICD-10-CM | POA: Diagnosis not present

## 2017-01-11 DIAGNOSIS — R5383 Other fatigue: Secondary | ICD-10-CM | POA: Diagnosis not present

## 2017-01-11 MED ORDER — ENALAPRIL MALEATE 5 MG PO TABS: 5.0000 mg | ORAL_TABLET | Freq: Every day | ORAL | 1 refills | Status: DC

## 2017-01-11 NOTE — Progress Notes (Signed)
Subjective:    Patient ID: Omar Taylor, male    DOB: 09/03/1945, 71 y.o.   MRN: [id number]  HPI  AWV- Annual Wellness Visit  The patient was seen for their annual wellness visit. The patient's past medical history, surgical history, and family history were reviewed. Pertinent vaccines were reviewed ( tetanus, pneumonia, shingles, flu) The patient's medication list was reviewed and updated.  The height and weight were entered. The patient's current BMI is:28.69  Cognitive screening was completed. Outcome of Mini - Cog: pass  Falls within the past 6 months:none  Current tobacco usage: no (All patients who use tobacco were given written and verbal information on quitting)  Recent listing of emergency department/hospitalizations over the past year were reviewed.  current specialist the patient sees on a regular basis: none   Medicare annual wellness visit patient questionnaire was reviewed.  A written screening schedule for the patient for the next 5-10 years was given. Appropriate discussion of followup regarding next visit was discussed.  Patient also needs refill on blood pressure medication  Results for orders placed or performed in visit on 07/10/16  POCT glycosylated hemoglobin (Hb A1C)  Result Value Ref Range   Hemoglobin A1C 6.1    Blood pressure medicine and blood pressure levels reviewed today with patient. Compliant with blood pressure medicine. States does not miss a dose. No obvious side effects. Blood pressure generally good when checked elsewhere. Watching salt intake.  Exercise doing reg  Watching diet    Watching sugar in the diet  cks b p once in a while ck usually numbers good syst around 130 or so lower round 22 f  flushot    Review of Systems  Constitutional: Negative for activity change, appetite change and fever.  HENT: Negative for congestion and rhinorrhea.   Eyes: Negative for discharge.  Respiratory: Negative for cough and wheezing.    Cardiovascular: Negative for chest pain.  Gastrointestinal: Negative for abdominal pain, blood in stool and vomiting.  Genitourinary: Negative for difficulty urinating and frequency.  Musculoskeletal: Negative for neck pain.  Skin: Negative for rash.  Allergic/Immunologic: Negative for environmental allergies and food allergies.  Neurological: Negative for weakness and headaches.  Psychiatric/Behavioral: Negative for agitation.  All other systems reviewed and are negative.      Objective:   Physical Exam  Constitutional: He appears well-developed and well-nourished.  HENT:  Head: Normocephalic and atraumatic.  Right Ear: External ear normal.  Left Ear: External ear normal.  Nose: Nose normal.  Mouth/Throat: Oropharynx is clear and moist.  Eyes: EOM are normal. Pupils are equal, round, and reactive to light.  Neck: Normal range of motion. Neck supple. No thyromegaly present.  Cardiovascular: Normal rate, regular rhythm and normal heart sounds.  No murmur heard. Pulmonary/Chest: Effort normal and breath sounds normal. No respiratory distress. He has no wheezes.  Abdominal: Soft. Bowel sounds are normal. He exhibits no distension and no mass. There is no tenderness.  Genitourinary: Penis normal.  Musculoskeletal: Normal range of motion. He exhibits no edema.  Lymphadenopathy:    He has no cervical adenopathy.  Neurological: He is alert. He exhibits normal muscle tone.  Skin: Skin is warm and dry. No erythema.  Psychiatric: He has a normal mood and affect. His behavior is normal. Judgment normal.  Vitals reviewed.         Assessment & Plan:  Impression wellness exam.  Diet exercise discussed.  Up-to-date on colonoscopy.  Flu shot today.  Up-to-date on pneumonia shots  2.  Hypertension discussed compliance discussed blood pressure discussed  3.  Impaired fasting glucose test plan await blood work.  Flu medications refilled diet exercise discussed recheck in 6 months

## 2017-01-12 LAB — CBC WITH DIFFERENTIAL/PLATELET
BASOS: 1 %
Basophils Absolute: 0 10*3/uL (ref 0.0–0.2)
EOS (ABSOLUTE): 0.2 10*3/uL (ref 0.0–0.4)
Eos: 3 %
Hematocrit: 45.3 % (ref 37.5–51.0)
Hemoglobin: 15 g/dL (ref 13.0–17.7)
Immature Grans (Abs): 0 10*3/uL (ref 0.0–0.1)
Immature Granulocytes: 0 %
LYMPHS ABS: 1.4 10*3/uL (ref 0.7–3.1)
Lymphs: 21 %
MCH: 30.4 pg (ref 26.6–33.0)
MCHC: 33.1 g/dL (ref 31.5–35.7)
MCV: 92 fL (ref 79–97)
Monocytes Absolute: 0.6 10*3/uL (ref 0.1–0.9)
Monocytes: 10 %
NEUTROS ABS: 4.2 10*3/uL (ref 1.4–7.0)
Neutrophils: 65 %
PLATELETS: 247 10*3/uL (ref 150–379)
RBC: 4.94 x10E6/uL (ref 4.14–5.80)
RDW: 13.6 % (ref 12.3–15.4)
WBC: 6.4 10*3/uL (ref 3.4–10.8)

## 2017-01-12 LAB — HEPATIC FUNCTION PANEL
ALT: 22 IU/L (ref 0–44)
AST: 20 IU/L (ref 0–40)
Albumin: 4.7 g/dL (ref 3.6–4.8)
Alkaline Phosphatase: 96 IU/L (ref 39–117)
Bilirubin Total: 0.4 mg/dL (ref 0.0–1.2)
Bilirubin, Direct: 0.11 mg/dL (ref 0.00–0.40)
Total Protein: 7.3 g/dL (ref 6.0–8.5)

## 2017-01-12 LAB — LIPID PANEL
CHOLESTEROL TOTAL: 166 mg/dL (ref 100–199)
Chol/HDL Ratio: 3.3 ratio (ref 0.0–5.0)
HDL: 50 mg/dL (ref >39)
LDL Calculated: 108 mg/dL — ABNORMAL HIGH (ref 0–99)
Triglycerides: 42 mg/dL (ref 0–149)
VLDL Cholesterol Cal: 8 mg/dL (ref 5–40)

## 2017-01-12 LAB — BASIC METABOLIC PANEL
BUN / CREAT RATIO: 16 (ref 10–24)
BUN: 14 mg/dL (ref 8–27)
CALCIUM: 9.8 mg/dL (ref 8.6–10.2)
CO2: 25 mmol/L (ref 20–29)
Chloride: 102 mmol/L (ref 96–106)
Creatinine, Ser: 0.86 mg/dL (ref 0.76–1.27)
GFR calc non Af Amer: 88 mL/min/{1.73_m2} (ref >59)
GFR, EST AFRICAN AMERICAN: 102 mL/min/{1.73_m2} (ref >59)
Glucose: 122 mg/dL — ABNORMAL HIGH (ref 65–99)
POTASSIUM: 5.2 mmol/L (ref 3.5–5.2)
SODIUM: 140 mmol/L (ref 134–144)

## 2017-01-12 LAB — PSA: Prostate Specific Ag, Serum: 1.8 ng/mL (ref 0.0–4.0)

## 2017-01-13 ENCOUNTER — Encounter: Payer: Self-pay | Admitting: Family Medicine

## 2017-07-10 ENCOUNTER — Ambulatory Visit (INDEPENDENT_AMBULATORY_CARE_PROVIDER_SITE_OTHER): Payer: Medicare Other | Admitting: Family Medicine

## 2017-07-10 ENCOUNTER — Encounter: Payer: Self-pay | Admitting: Family Medicine

## 2017-07-10 VITALS — BP 140/82 | HR 70 | Ht 72.0 in | Wt 206.2 lb

## 2017-07-10 DIAGNOSIS — I10 Essential (primary) hypertension: Secondary | ICD-10-CM

## 2017-07-10 MED ORDER — ENALAPRIL MALEATE 5 MG PO TABS: 5.0000 mg | ORAL_TABLET | Freq: Every day | ORAL | 1 refills | Status: DC

## 2017-07-10 NOTE — Progress Notes (Signed)
   Subjective:    Patient ID: Omar Taylor, male    DOB: 25-Jun-1945, 72 y.o.   MRN: [id number]  Hypertension  This is a chronic problem. The problem has been gradually improving since onset. Treatments tried: Vasotec. There are no compliance problems.    Blood pressure medicine and blood pressure levels reviewed today with patient. Compliant with blood pressure medicine. States does not miss a dose. No obvious side effects. Blood pressure generally good when checked elsewhere. Watching salt intake.   Watching sweets and sugars, has cut down sugar iand watching close     Review of Systems No headache, no major weight loss or weight gain, no chest pain no back pain abdominal pain no change in bowel habits complete ROS otherwise negative     Objective:   Physical Exam  Alert vitals stable, NAD. Blood pressure good on repeat. HEENT normal. Lungs clear. Heart regular rate and rhythm.       Assessment & Plan:  #1 impression hypertension.  Good control.  Discussed with patient maintain same.  Diet exercise discussed.  Follow-up in 6 months for wellness plus chronic blood work

## 2018-01-01 ENCOUNTER — Telehealth: Payer: Self-pay | Admitting: Family Medicine

## 2018-01-01 DIAGNOSIS — Z125 Encounter for screening for malignant neoplasm of prostate: Secondary | ICD-10-CM

## 2018-01-01 DIAGNOSIS — Z79899 Other long term (current) drug therapy: Secondary | ICD-10-CM

## 2018-01-01 DIAGNOSIS — Z13220 Encounter for screening for lipoid disorders: Secondary | ICD-10-CM

## 2018-01-01 DIAGNOSIS — Z0000 Encounter for general adult medical examination without abnormal findings: Secondary | ICD-10-CM

## 2018-01-01 DIAGNOSIS — I10 Essential (primary) hypertension: Secondary | ICD-10-CM

## 2018-01-01 NOTE — Telephone Encounter (Signed)
Patient had labs last 01/11/2017 Lip,hepatic,bmet,psa,cbc. Same? Please advise.

## 2018-01-01 NOTE — Telephone Encounter (Signed)
Pt has physical for 01/13/2018 w/ Dr.Steve requesting blood work, would like to complete before hand. Advise.

## 2018-01-05 NOTE — Telephone Encounter (Signed)
Sure same

## 2018-01-06 NOTE — Telephone Encounter (Signed)
bw orders put in. Tried to call to notify pt - phone did not ring on either number in chart.

## 2018-01-06 NOTE — Telephone Encounter (Signed)
Patient notified and verbalized understanding. 

## 2018-01-07 DIAGNOSIS — Z13220 Encounter for screening for lipoid disorders: Secondary | ICD-10-CM | POA: Diagnosis not present

## 2018-01-07 DIAGNOSIS — Z0000 Encounter for general adult medical examination without abnormal findings: Secondary | ICD-10-CM | POA: Diagnosis not present

## 2018-01-07 DIAGNOSIS — Z79899 Other long term (current) drug therapy: Secondary | ICD-10-CM | POA: Diagnosis not present

## 2018-01-07 DIAGNOSIS — I10 Essential (primary) hypertension: Secondary | ICD-10-CM | POA: Diagnosis not present

## 2018-01-07 DIAGNOSIS — Z125 Encounter for screening for malignant neoplasm of prostate: Secondary | ICD-10-CM | POA: Diagnosis not present

## 2018-01-08 LAB — CBC WITH DIFFERENTIAL/PLATELET
BASOS ABS: 0 10*3/uL (ref 0.0–0.2)
BASOS: 0 %
EOS (ABSOLUTE): 0.2 10*3/uL (ref 0.0–0.4)
Eos: 3 %
HEMOGLOBIN: 15.3 g/dL (ref 13.0–17.7)
Hematocrit: 44.9 % (ref 37.5–51.0)
Immature Grans (Abs): 0 10*3/uL (ref 0.0–0.1)
Immature Granulocytes: 0 %
Lymphocytes Absolute: 1.8 10*3/uL (ref 0.7–3.1)
Lymphs: 26 %
MCH: 30.7 pg (ref 26.6–33.0)
MCHC: 34.1 g/dL (ref 31.5–35.7)
MCV: 90 fL (ref 79–97)
MONOCYTES: 9 %
MONOS ABS: 0.6 10*3/uL (ref 0.1–0.9)
Neutrophils Absolute: 4.2 10*3/uL (ref 1.4–7.0)
Neutrophils: 62 %
Platelets: 243 10*3/uL (ref 150–450)
RBC: 4.98 x10E6/uL (ref 4.14–5.80)
RDW: 12.1 % — AB (ref 12.3–15.4)
WBC: 6.9 10*3/uL (ref 3.4–10.8)

## 2018-01-08 LAB — HEPATIC FUNCTION PANEL
ALBUMIN: 4.5 g/dL (ref 3.5–4.8)
ALT: 19 IU/L (ref 0–44)
AST: 17 IU/L (ref 0–40)
Alkaline Phosphatase: 104 IU/L (ref 39–117)
BILIRUBIN TOTAL: 0.3 mg/dL (ref 0.0–1.2)
BILIRUBIN, DIRECT: 0.11 mg/dL (ref 0.00–0.40)
Total Protein: 7 g/dL (ref 6.0–8.5)

## 2018-01-08 LAB — BASIC METABOLIC PANEL
BUN / CREAT RATIO: 22 (ref 10–24)
BUN: 18 mg/dL (ref 8–27)
CO2: 24 mmol/L (ref 20–29)
CREATININE: 0.82 mg/dL (ref 0.76–1.27)
Calcium: 9.9 mg/dL (ref 8.6–10.2)
Chloride: 98 mmol/L (ref 96–106)
GFR calc Af Amer: 104 mL/min/{1.73_m2} (ref >59)
GFR, EST NON AFRICAN AMERICAN: 90 mL/min/{1.73_m2} (ref >59)
GLUCOSE: 127 mg/dL — AB (ref 65–99)
Potassium: 5.3 mmol/L — ABNORMAL HIGH (ref 3.5–5.2)
SODIUM: 136 mmol/L (ref 134–144)

## 2018-01-08 LAB — LIPID PANEL
CHOL/HDL RATIO: 3.7 ratio (ref 0.0–5.0)
CHOLESTEROL TOTAL: 182 mg/dL (ref 100–199)
HDL: 49 mg/dL (ref >39)
LDL Calculated: 122 mg/dL — ABNORMAL HIGH (ref 0–99)
Triglycerides: 53 mg/dL (ref 0–149)
VLDL Cholesterol Cal: 11 mg/dL (ref 5–40)

## 2018-01-08 LAB — PSA: Prostate Specific Ag, Serum: 2 ng/mL (ref 0.0–4.0)

## 2018-01-13 ENCOUNTER — Encounter: Payer: Self-pay | Admitting: Family Medicine

## 2018-01-13 ENCOUNTER — Ambulatory Visit (INDEPENDENT_AMBULATORY_CARE_PROVIDER_SITE_OTHER): Payer: Medicare Other | Admitting: Family Medicine

## 2018-01-13 VITALS — BP 134/86 | Ht 71.75 in | Wt 206.4 lb

## 2018-01-13 DIAGNOSIS — I10 Essential (primary) hypertension: Secondary | ICD-10-CM | POA: Diagnosis not present

## 2018-01-13 DIAGNOSIS — Z23 Encounter for immunization: Secondary | ICD-10-CM

## 2018-01-13 DIAGNOSIS — Z0000 Encounter for general adult medical examination without abnormal findings: Secondary | ICD-10-CM | POA: Diagnosis not present

## 2018-01-13 DIAGNOSIS — R7301 Impaired fasting glucose: Secondary | ICD-10-CM | POA: Diagnosis not present

## 2018-01-13 LAB — POCT GLYCOSYLATED HEMOGLOBIN (HGB A1C): Hemoglobin A1C: 5.6 % (ref 4.0–5.6)

## 2018-01-13 MED ORDER — ENALAPRIL MALEATE 5 MG PO TABS: 5.0000 mg | ORAL_TABLET | Freq: Every day | ORAL | 1 refills | Status: DC

## 2018-01-13 NOTE — Progress Notes (Signed)
Subjective:    Patient ID: Omar Taylor, male    DOB: 02/22/1945, 72 y.o.   MRN: [id number]  HPI AWV- Annual Wellness Visit  The patient was seen for their annual wellness visit. The patient's past medical history, surgical history, and family history were reviewed. Pertinent vaccines were reviewed ( tetanus, pneumonia, shingles, flu) The patient's medication list was reviewed and updated.  The height and weight were entered.  BMI recorded in electronic record elsewhere  Cognitive screening was completed. Outcome of Mini - Cog: pass   Falls /depression screening electronically recorded within record elsewhere  Current tobacco usage: none (All patients who use tobacco were given written and verbal information on quitting)  Recent listing of emergency department/hospitalizations over the past year were reviewed.  current specialist the patient sees on a regular basis: none   Medicare annual wellness visit patient questionnaire was reviewed.  A written screening schedule for the patient for the next 5-10 years was given. Appropriate discussion of followup regarding next visit was discussed.  Flu vaccine today.    Results for orders placed or performed in visit on 01/01/18  Lipid panel  Result Value Ref Range   Cholesterol, Total 182 100 - 199 mg/dL   Triglycerides 53 0 - 149 mg/dL   HDL 49 >39 mg/dL   VLDL Cholesterol Cal 11 5 - 40 mg/dL   LDL Calculated 122 (H) 0 - 99 mg/dL   Chol/HDL Ratio 3.7 0.0 - 5.0 ratio  Hepatic function panel  Result Value Ref Range   Total Protein 7.0 6.0 - 8.5 g/dL   Albumin 4.5 3.5 - 4.8 g/dL   Bilirubin Total 0.3 0.0 - 1.2 mg/dL   Bilirubin, Direct 0.11 0.00 - 0.40 mg/dL   Alkaline Phosphatase 104 39 - 117 IU/L   AST 17 0 - 40 IU/L   ALT 19 0 - 44 IU/L  Basic metabolic panel  Result Value Ref Range   Glucose 127 (H) 65 - 99 mg/dL   BUN 18 8 - 27 mg/dL   Creatinine, Ser 0.82 0.76 - 1.27 mg/dL   GFR calc non Af Amer 90 >59  mL/min/1.73   GFR calc Af Amer 104 >59 mL/min/1.73   BUN/Creatinine Ratio 22 10 - 24   Sodium 136 134 - 144 mmol/L   Potassium 5.3 (H) 3.5 - 5.2 mmol/L   Chloride 98 96 - 106 mmol/L   CO2 24 20 - 29 mmol/L   Calcium 9.9 8.6 - 10.2 mg/dL  PSA  Result Value Ref Range   Prostate Specific Ag, Serum 2.0 0.0 - 4.0 ng/mL  CBC with Differential/Platelet  Result Value Ref Range   WBC 6.9 3.4 - 10.8 x10E3/uL   RBC 4.98 4.14 - 5.80 x10E6/uL   Hemoglobin 15.3 13.0 - 17.7 g/dL   Hematocrit 44.9 37.5 - 51.0 %   MCV 90 79 - 97 fL   MCH 30.7 26.6 - 33.0 pg   MCHC 34.1 31.5 - 35.7 g/dL   RDW 12.1 (L) 12.3 - 15.4 %   Platelets 243 150 - 450 x10E3/uL   Neutrophils 62 Not Estab. %   Lymphs 26 Not Estab. %   Monocytes 9 Not Estab. %   Eos 3 Not Estab. %   Basos 0 Not Estab. %   Neutrophils Absolute 4.2 1.4 - 7.0 x10E3/uL   Lymphocytes Absolute 1.8 0.7 - 3.1 x10E3/uL   Monocytes Absolute 0.6 0.1 - 0.9 x10E3/uL   EOS (ABSOLUTE) 0.2 0.0 - 0.4 x10E3/uL  Basophils Absolute 0.0 0.0 - 0.2 x10E3/uL   Immature Granulocytes 0 Not Estab. %   Immature Grans (Abs) 0.0 0.0 - 0.1 x10E3/uL   Exercising reg  Blood pressure medicine and blood pressure levels reviewed today with patient. Compliant with blood pressure medicine. States does not miss a dose. No obvious side effects. Blood pressure generally good when checked elsewhere. Watching salt intake.   Numbers geenrally good when cked elsewhere      Review of Systems  Constitutional: Negative for activity change, appetite change and fever.  HENT: Negative for congestion and rhinorrhea.   Eyes: Negative for discharge.  Respiratory: Negative for cough and wheezing.   Cardiovascular: Negative for chest pain.  Gastrointestinal: Negative for abdominal pain, blood in stool and vomiting.  Genitourinary: Negative for difficulty urinating and frequency.  Musculoskeletal: Negative for neck pain.  Skin: Negative for rash.  Allergic/Immunologic: Negative  for environmental allergies and food allergies.  Neurological: Negative for weakness and headaches.  Psychiatric/Behavioral: Negative for agitation.  All other systems reviewed and are negative.      Objective:   Physical Exam Vitals signs reviewed.  Constitutional:      Appearance: He is well-developed.  HENT:     Head: Normocephalic and atraumatic.     Right Ear: External ear normal.     Left Ear: External ear normal.     Nose: Nose normal.  Eyes:     Pupils: Pupils are equal, round, and reactive to light.  Neck:     Musculoskeletal: Normal range of motion and neck supple.     Thyroid: No thyromegaly.  Cardiovascular:     Rate and Rhythm: Normal rate and regular rhythm.     Heart sounds: Normal heart sounds. No murmur.  Pulmonary:     Effort: Pulmonary effort is normal. No respiratory distress.     Breath sounds: Normal breath sounds. No wheezing.  Abdominal:     General: Bowel sounds are normal. There is no distension.     Palpations: Abdomen is soft. There is no mass.     Tenderness: There is no abdominal tenderness.  Genitourinary:    Penis: Normal.   Musculoskeletal: Normal range of motion.  Lymphadenopathy:     Cervical: No cervical adenopathy.  Skin:    General: Skin is warm and dry.     Findings: No erythema.  Neurological:     Mental Status: He is alert.     Motor: No abnormal muscle tone.  Psychiatric:        Behavior: Behavior normal.        Judgment: Judgment normal.           Assessment & Plan:  Impression wellness.   Diet disc.  exrcise disc. Colon nex due in two yrs.  Vaccines discussed and administered were appropriate.  General mental health discussed overall doing well.  htm.  Compliant with medication.  Handling well.  No obvious side effects.  Prediabetes.  Clinically stable numbers reviewed  Medications refilled follow-up in 6 months diet exercise discussed

## 2018-07-07 ENCOUNTER — Ambulatory Visit (INDEPENDENT_AMBULATORY_CARE_PROVIDER_SITE_OTHER): Payer: Medicare Other | Admitting: Family Medicine

## 2018-07-07 ENCOUNTER — Other Ambulatory Visit: Payer: Self-pay

## 2018-07-07 DIAGNOSIS — I10 Essential (primary) hypertension: Secondary | ICD-10-CM | POA: Diagnosis not present

## 2018-07-07 MED ORDER — ENALAPRIL MALEATE 5 MG PO TABS: 5.0000 mg | ORAL_TABLET | Freq: Every day | ORAL | 1 refills | Status: DC

## 2018-07-07 NOTE — Progress Notes (Signed)
   Subjective:    Patient ID: Omar Taylor, male    DOB: [birth year], 73 y.o.   MRN: [id number] Audio only Hypertension  This is a chronic problem. The current episode started more than 1 year ago. Treatments tried: enalapril 5mg  daily. There are no compliance problems (takes meds every day, eats healthy, exercises, garden work).    Pt states no concerns today.   Virtual Visit via Telephone Note  I connected with [name] on 07/07/18 at 10:00 AM EDT by telephone and verified that I am speaking with the correct person using two identifiers.  Location: Patient: home Provider: office   I discussed the limitations, risks, security and privacy concerns of performing an evaluation and management service by telephone and the availability of in person appointments. I also discussed with the patient that there may be a patient responsible charge related to this service. The patient expressed understanding and agreed to proceed.   History of Present Illness:    Observations/Objective:   Assessment and Plan:   Follow Up Instructions:    I discussed the assessment and treatment plan with the patient. The patient was provided an opportunity to ask questions and all were answered. The patient agreed with the plan and demonstrated an understanding of the instructions.   The patient was advised to call back or seek an in-person evaluation if the symptoms worsen or if the condition fails to improve as anticipated.  I provided 15 minutes of non-face-to-face time during this encounter.  120 over 80  Blood pressure medicine and blood pressure levels reviewed today with patient. Compliant with blood pressure medicine. States does not miss a dose. No obvious side effects. Blood pressure generally good when checked elsewhere. Watching salt intake.     Review of Systems .rs No headache, no major weight loss or weight gain, no chest pain no back pain abdominal pain no change in bowel  habits complete ROS otherwise negative     Objective:   Physical Exam  virt      Assessment & Plan:  htn bp s good, disc, compl good, no se s, diet ex disc, meds ref

## 2018-12-11 DIAGNOSIS — Z23 Encounter for immunization: Secondary | ICD-10-CM | POA: Diagnosis not present

## 2018-12-19 ENCOUNTER — Other Ambulatory Visit: Payer: Self-pay

## 2019-01-13 ENCOUNTER — Telehealth: Payer: Self-pay | Admitting: Family Medicine

## 2019-01-13 DIAGNOSIS — Z79899 Other long term (current) drug therapy: Secondary | ICD-10-CM

## 2019-01-13 DIAGNOSIS — Z0000 Encounter for general adult medical examination without abnormal findings: Secondary | ICD-10-CM

## 2019-01-13 DIAGNOSIS — Z125 Encounter for screening for malignant neoplasm of prostate: Secondary | ICD-10-CM

## 2019-01-13 DIAGNOSIS — I10 Essential (primary) hypertension: Secondary | ICD-10-CM

## 2019-01-13 DIAGNOSIS — Z13220 Encounter for screening for lipoid disorders: Secondary | ICD-10-CM

## 2019-01-13 NOTE — Telephone Encounter (Signed)
same

## 2019-01-13 NOTE — Telephone Encounter (Signed)
Pt would like lab orders placed so he can have them done before CPE on 12/21.

## 2019-01-13 NOTE — Telephone Encounter (Signed)
Lab orders placed and pt verbalized understanding.

## 2019-01-13 NOTE — Telephone Encounter (Signed)
Last labs completed on 01/07/18 CBC, PSA, BMET, HEP and LIP. Please advise. Thank you

## 2019-01-15 DIAGNOSIS — Z125 Encounter for screening for malignant neoplasm of prostate: Secondary | ICD-10-CM | POA: Diagnosis not present

## 2019-01-15 DIAGNOSIS — Z0000 Encounter for general adult medical examination without abnormal findings: Secondary | ICD-10-CM | POA: Diagnosis not present

## 2019-01-15 DIAGNOSIS — Z13220 Encounter for screening for lipoid disorders: Secondary | ICD-10-CM | POA: Diagnosis not present

## 2019-01-15 DIAGNOSIS — I10 Essential (primary) hypertension: Secondary | ICD-10-CM | POA: Diagnosis not present

## 2019-01-15 DIAGNOSIS — Z79899 Other long term (current) drug therapy: Secondary | ICD-10-CM | POA: Diagnosis not present

## 2019-01-16 LAB — LIPID PANEL
Chol/HDL Ratio: 3.7 ratio (ref 0.0–5.0)
Cholesterol, Total: 187 mg/dL (ref 100–199)
HDL: 50 mg/dL (ref >39)
LDL Chol Calc (NIH): 124 mg/dL — ABNORMAL HIGH (ref 0–99)
Triglycerides: 67 mg/dL (ref 0–149)
VLDL Cholesterol Cal: 13 mg/dL (ref 5–40)

## 2019-01-16 LAB — BASIC METABOLIC PANEL
BUN/Creatinine Ratio: 19 (ref 10–24)
BUN: 16 mg/dL (ref 8–27)
CO2: 23 mmol/L (ref 20–29)
Calcium: 9.8 mg/dL (ref 8.6–10.2)
Chloride: 101 mmol/L (ref 96–106)
Creatinine, Ser: 0.86 mg/dL (ref 0.76–1.27)
GFR calc Af Amer: 101 mL/min/{1.73_m2} (ref >59)
GFR calc non Af Amer: 87 mL/min/{1.73_m2} (ref >59)
Glucose: 121 mg/dL — ABNORMAL HIGH (ref 65–99)
Potassium: 5 mmol/L (ref 3.5–5.2)
Sodium: 137 mmol/L (ref 134–144)

## 2019-01-16 LAB — HEPATIC FUNCTION PANEL
ALT: 17 IU/L (ref 0–44)
AST: 17 IU/L (ref 0–40)
Albumin: 4.6 g/dL (ref 3.7–4.7)
Alkaline Phosphatase: 109 IU/L (ref 39–117)
Bilirubin Total: 0.5 mg/dL (ref 0.0–1.2)
Bilirubin, Direct: 0.12 mg/dL (ref 0.00–0.40)
Total Protein: 7.3 g/dL (ref 6.0–8.5)

## 2019-01-16 LAB — CBC WITH DIFFERENTIAL/PLATELET
Basophils Absolute: 0 10*3/uL (ref 0.0–0.2)
Basos: 1 %
EOS (ABSOLUTE): 0.2 10*3/uL (ref 0.0–0.4)
Eos: 3 %
Hematocrit: 46.1 % (ref 37.5–51.0)
Hemoglobin: 15.6 g/dL (ref 13.0–17.7)
Immature Grans (Abs): 0 10*3/uL (ref 0.0–0.1)
Immature Granulocytes: 0 %
Lymphocytes Absolute: 1.6 10*3/uL (ref 0.7–3.1)
Lymphs: 24 %
MCH: 30.1 pg (ref 26.6–33.0)
MCHC: 33.8 g/dL (ref 31.5–35.7)
MCV: 89 fL (ref 79–97)
Monocytes Absolute: 0.6 10*3/uL (ref 0.1–0.9)
Monocytes: 9 %
Neutrophils Absolute: 4.4 10*3/uL (ref 1.4–7.0)
Neutrophils: 63 %
Platelets: 236 10*3/uL (ref 150–450)
RBC: 5.18 x10E6/uL (ref 4.14–5.80)
RDW: 12.2 % (ref 11.6–15.4)
WBC: 6.9 10*3/uL (ref 3.4–10.8)

## 2019-01-16 LAB — PSA: Prostate Specific Ag, Serum: 1.8 ng/mL (ref 0.0–4.0)

## 2019-01-19 ENCOUNTER — Other Ambulatory Visit: Payer: Self-pay

## 2019-01-19 ENCOUNTER — Ambulatory Visit (INDEPENDENT_AMBULATORY_CARE_PROVIDER_SITE_OTHER): Payer: Medicare Other | Admitting: Family Medicine

## 2019-01-19 ENCOUNTER — Encounter: Payer: Self-pay | Admitting: Family Medicine

## 2019-01-19 VITALS — BP 130/82 | Temp 98.3°F | Ht 71.75 in | Wt 210.6 lb

## 2019-01-19 DIAGNOSIS — R7301 Impaired fasting glucose: Secondary | ICD-10-CM

## 2019-01-19 DIAGNOSIS — Z0000 Encounter for general adult medical examination without abnormal findings: Secondary | ICD-10-CM

## 2019-01-19 DIAGNOSIS — I10 Essential (primary) hypertension: Secondary | ICD-10-CM

## 2019-01-19 LAB — POCT GLYCOSYLATED HEMOGLOBIN (HGB A1C): Hemoglobin A1C: 5.4 % (ref 4.0–5.6)

## 2019-01-19 MED ORDER — ENALAPRIL MALEATE 5 MG PO TABS: 5.0000 mg | ORAL_TABLET | Freq: Every day | ORAL | 1 refills | Status: DC

## 2019-01-19 NOTE — Progress Notes (Signed)
Subjective:    Patient ID: Omar Taylor, male    DOB: [birth year], 73 y.o.   MRN: [id number]  HPI  The patient comes in today for a wellness visit.    A review of their health history was completed.  A review of medications was also completed.  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   Additional concerns: none Results for orders placed or performed in visit on 01/13/19  CBC with Differential  Result Value Ref Range   WBC 6.9 3.4 - 10.8 x10E3/uL   RBC 5.18 4.14 - 5.80 x10E6/uL   Hemoglobin 15.6 13.0 - 17.7 g/dL   Hematocrit 46.1 37.5 - 51.0 %   MCV 89 79 - 97 fL   MCH 30.1 26.6 - 33.0 pg   MCHC 33.8 31.5 - 35.7 g/dL   RDW 12.2 11.6 - 15.4 %   Platelets 236 150 - 450 x10E3/uL   Neutrophils 63 Not Estab. %   Lymphs 24 Not Estab. %   Monocytes 9 Not Estab. %   Eos 3 Not Estab. %   Basos 1 Not Estab. %   Neutrophils Absolute 4.4 1.4 - 7.0 x10E3/uL   Lymphocytes Absolute 1.6 0.7 - 3.1 x10E3/uL   Monocytes Absolute 0.6 0.1 - 0.9 x10E3/uL   EOS (ABSOLUTE) 0.2 0.0 - 0.4 x10E3/uL   Basophils Absolute 0.0 0.0 - 0.2 x10E3/uL   Immature Granulocytes 0 Not Estab. %   Immature Grans (Abs) 0.0 0.0 - 0.1 x10E3/uL  PSA  Result Value Ref Range   Prostate Specific Ag, Serum 1.8 0.0 - 4.0 ng/mL  Basic Metabolic Panel (BMET)  Result Value Ref Range   Glucose 121 (H) 65 - 99 mg/dL   BUN 16 8 - 27 mg/dL   Creatinine, Ser 0.86 0.76 - 1.27 mg/dL   GFR calc non Af Amer 87 >59 mL/min/1.73   GFR calc Af Amer 101 >59 mL/min/1.73   BUN/Creatinine Ratio 19 10 - 24   Sodium 137 134 - 144 mmol/L   Potassium 5.0 3.5 - 5.2 mmol/L   Chloride 101 96 - 106 mmol/L   CO2 23 20 - 29 mmol/L   Calcium 9.8 8.6 - 10.2 mg/dL  Hepatic function panel  Result Value Ref Range   Total Protein  7.3 6.0 - 8.5 g/dL   Albumin 4.6 3.7 - 4.7 g/dL   Bilirubin Total 0.5 0.0 - 1.2 mg/dL   Bilirubin, Direct 0.12 0.00 - 0.40 mg/dL   Alkaline Phosphatase 109 39 - 117 IU/L   AST 17 0 - 40 IU/L   ALT 17 0 - 44 IU/L  Lipid Profile  Result Value Ref Range   Cholesterol, Total 187 100 - 199 mg/dL   Triglycerides 67 0 - 149 mg/dL   HDL 50 >39 mg/dL   VLDL Cholesterol Cal 13 5 - 40 mg/dL   LDL Chol Calc (NIH) 124 (H) 0 - 99 mg/dL   Chol/HDL Ratio 3.7 0.0 - 5.0 ratio   Blood pressure medicine and blood pressure levels reviewed today with patient. Compliant with blood pressure medicine. States does not miss a dose. No obvious side effects. Blood pressure generally good when checked elsewhere. Watching salt intake.  Staying very active   Keeps active  Blood pressure medicine and blood pressure levels reviewed today  with patient. Compliant with blood pressure medicine. States does not miss a dose. No obvious side effects. Blood pressure generally good when checked elsewhere. Watching salt intake.   Results for orders placed or performed in visit on 01/19/19  POCT HgB A1C  Result Value Ref Range   Hemoglobin A1C 5.4 4.0 - 5.6 %   HbA1c POC (<> result, manual entry)     HbA1c, POC (prediabetic range)     HbA1c, POC (controlled diabetic range)       prediabets glu tends to be on the side  Review of Systems  Constitutional: Negative for activity change, appetite change and fever.  HENT: Negative for congestion and rhinorrhea.   Eyes: Negative for discharge.  Respiratory: Negative for cough and wheezing.   Cardiovascular: Negative for chest pain.  Gastrointestinal: Negative for abdominal pain, blood in stool and vomiting.  Genitourinary: Negative for difficulty urinating and frequency.  Musculoskeletal: Negative for neck pain.  Skin: Negative for rash.  Allergic/Immunologic: Negative for environmental allergies and food allergies.  Neurological: Negative for weakness and headaches.   Psychiatric/Behavioral: Negative for agitation.  All other systems reviewed and are negative.      Objective:   Physical Exam Vitals reviewed.  Constitutional:      Appearance: He is well-developed.  HENT:     Head: Normocephalic and atraumatic.     Right Ear: External ear normal.     Left Ear: External ear normal.     Nose: Nose normal.  Eyes:     Pupils: Pupils are equal, round, and reactive to light.  Neck:     Thyroid: No thyromegaly.  Cardiovascular:     Rate and Rhythm: Normal rate and regular rhythm.     Heart sounds: Normal heart sounds. No murmur.  Pulmonary:     Effort: Pulmonary effort is normal. No respiratory distress.     Breath sounds: Normal breath sounds. No wheezing.  Abdominal:     General: Bowel sounds are normal. There is no distension.     Palpations: Abdomen is soft. There is no mass.     Tenderness: There is no abdominal tenderness.  Genitourinary:    Penis: Normal.   Musculoskeletal:        General: Normal range of motion.     Cervical back: Normal range of motion and neck supple.  Lymphadenopathy:     Cervical: No cervical adenopathy.  Skin:    General: Skin is warm and dry.     Findings: No erythema.  Neurological:     Mental Status: He is alert.     Motor: No abnormal muscle tone.  Psychiatric:        Behavior: Behavior normal.        Judgment: Judgment normal.           Assessment & Plan:  Impression wellness exam.  Up-to-date on colonoscopy.  Diet discussed.  Exercise discussed.  Vaccines discussed.  2.  Hypertension.  Good control discussed maintain same meds rationale discussed  3.  Prediabetes.  Glucose elevated on blood work.  Discussed.  We went ahead and did an A1c.  Fortunately still in the fives.  Diet discussed in this regard  Follow-up in 6 months

## 2019-02-26 DIAGNOSIS — Z23 Encounter for immunization: Secondary | ICD-10-CM | POA: Diagnosis not present

## 2019-03-05 ENCOUNTER — Encounter: Payer: Self-pay | Admitting: Family Medicine

## 2019-05-27 ENCOUNTER — Encounter (INDEPENDENT_AMBULATORY_CARE_PROVIDER_SITE_OTHER): Payer: Self-pay | Admitting: *Deleted

## 2019-06-09 ENCOUNTER — Telehealth: Payer: Self-pay | Admitting: Family Medicine

## 2019-06-09 DIAGNOSIS — Z0000 Encounter for general adult medical examination without abnormal findings: Secondary | ICD-10-CM

## 2019-06-09 DIAGNOSIS — Z79899 Other long term (current) drug therapy: Secondary | ICD-10-CM

## 2019-06-09 DIAGNOSIS — Z13220 Encounter for screening for lipoid disorders: Secondary | ICD-10-CM

## 2019-06-09 DIAGNOSIS — I10 Essential (primary) hypertension: Secondary | ICD-10-CM

## 2019-06-09 NOTE — Telephone Encounter (Signed)
Last Labs 12/2018: Lipid, Liver, Met 7, PSA, CBC, HgbA1c

## 2019-06-09 NOTE — Telephone Encounter (Signed)
Yes pls order as written. Thx, dr. Darene Lamer

## 2019-06-09 NOTE — Telephone Encounter (Signed)
Lab orders placed. Left message to return call  

## 2019-06-09 NOTE — Telephone Encounter (Signed)
Pt has 6 month follow up in June and would like to know if lab work is needed.

## 2019-06-10 NOTE — Telephone Encounter (Signed)
Patient notified

## 2019-07-21 ENCOUNTER — Ambulatory Visit (INDEPENDENT_AMBULATORY_CARE_PROVIDER_SITE_OTHER): Payer: Medicare Other | Admitting: Family Medicine

## 2019-07-21 ENCOUNTER — Other Ambulatory Visit: Payer: Self-pay

## 2019-07-21 ENCOUNTER — Encounter: Payer: Self-pay | Admitting: Family Medicine

## 2019-07-21 VITALS — BP 142/85 | HR 66 | Temp 97.4°F | Ht 71.75 in | Wt 204.2 lb

## 2019-07-21 DIAGNOSIS — I10 Essential (primary) hypertension: Secondary | ICD-10-CM

## 2019-07-21 DIAGNOSIS — R7303 Prediabetes: Secondary | ICD-10-CM | POA: Diagnosis not present

## 2019-07-21 MED ORDER — ENALAPRIL MALEATE 5 MG PO TABS: 5.0000 mg | ORAL_TABLET | Freq: Every day | ORAL | 1 refills | Status: DC

## 2019-07-21 NOTE — Progress Notes (Signed)
Patient ID: Omar Taylor, male    DOB: 1945-02-17, 74 y.o.   MRN: [id number]   Chief Complaint  Patient presents with  . Hypertension   Subjective:    HPI  Patient doing well. Patient would like to have blood work done when he leaves. Patient id fasting. Pt drinking 3 cups coffee in morning. Taking enalapril in evenings.  Doing a lot of work on his large yard.  HTN- doing well, no chest pain, sob, palpitations, LE edema or headache.  Medical History [name] has a past medical history of Arthritis, Hypertension, and Impaired fasting glucose.   Outpatient Encounter Medications as of 07/21/2019  Medication Sig  . enalapril (VASOTEC) 5 MG tablet Take 1 tablet (5 mg total) by mouth daily.  . [DISCONTINUED] enalapril (VASOTEC) 5 MG tablet Take 1 tablet (5 mg total) by mouth daily.   No facility-administered encounter medications on file as of 07/21/2019.     Review of Systems  Constitutional: Negative for chills and fever.  HENT: Negative for congestion, rhinorrhea and sore throat.   Respiratory: Negative for cough, shortness of breath and wheezing.   Cardiovascular: Negative for chest pain and leg swelling.  Gastrointestinal: Negative for abdominal pain, diarrhea, nausea and vomiting.  Genitourinary: Negative for dysuria and frequency.  Skin: Negative for rash.  Neurological: Negative for dizziness, weakness and headaches.     Vitals BP (!) 142/85   Pulse 66   Temp (!) 97.4 F (36.3 C) (Oral)   Ht 5' 11.75" (1.822 m)   Wt 204 lb 3.2 oz (92.6 kg)   SpO2 100%   BMI 27.89 kg/m   Objective:   Physical Exam Vitals and nursing note reviewed.  Constitutional:      General: He is not in acute distress.    Appearance: Normal appearance. He is not ill-appearing.  HENT:     Head: Normocephalic.     Nose: Nose normal.     Mouth/Throat:     Mouth: Mucous membranes are moist.  Eyes:     Extraocular Movements: Extraocular movements intact.     Conjunctiva/sclera:  Conjunctivae normal.     Pupils: Pupils are equal, round, and reactive to light.  Cardiovascular:     Rate and Rhythm: Normal rate and regular rhythm.     Pulses: Normal pulses.     Heart sounds: Normal heart sounds. No murmur heard.   Pulmonary:     Effort: Pulmonary effort is normal. No respiratory distress.     Breath sounds: Normal breath sounds. No wheezing, rhonchi or rales.  Musculoskeletal:        General: Normal range of motion.     Right lower leg: No edema.     Left lower leg: No edema.  Skin:    General: Skin is warm and dry.     Findings: No rash.  Neurological:     General: No focal deficit present.     Mental Status: He is alert and oriented to person, place, and time.     Cranial Nerves: No cranial nerve deficit.     Motor: No weakness.     Gait: Gait normal.  Psychiatric:        Mood and Affect: Mood normal.        Behavior: Behavior normal.        Thought Content: Thought content normal.        Judgment: Judgment normal.      Assessment and Plan   1. Essential hypertension, benign -  CBC - CMP14+EGFR - Lipid panel - enalapril (VASOTEC) 5 MG tablet; Take 1 tablet (5 mg total) by mouth daily.  Dispense: 90 tablet; Refill: 1  2. Prediabetes - Hemoglobin A1c    Recheck 142/85. Pt stating this happens occ but his bp usually runs 322-025K systolic. Pt wanting to wait on increasing his bp meds and recheck in 4month. Pt not wanting to increase the bp meds.  Reviewed risk vs. Benefits of keeping good control on bp. Would have recommended inc enalapril to '10mg'$  daily. Pt is going to do some home checks and call if seeing numbers over 150/90.  Will refill the enalapril for 671mo F/u 56m17mo prn.

## 2019-07-22 ENCOUNTER — Other Ambulatory Visit: Payer: Self-pay | Admitting: *Deleted

## 2019-07-22 DIAGNOSIS — Z13220 Encounter for screening for lipoid disorders: Secondary | ICD-10-CM

## 2019-07-22 DIAGNOSIS — R7303 Prediabetes: Secondary | ICD-10-CM

## 2019-07-22 LAB — CBC
Hematocrit: 47.3 % (ref 37.5–51.0)
Hemoglobin: 15.3 g/dL (ref 13.0–17.7)
MCH: 29.3 pg (ref 26.6–33.0)
MCHC: 32.3 g/dL (ref 31.5–35.7)
MCV: 91 fL (ref 79–97)
Platelets: 234 10*3/uL (ref 150–450)
RBC: 5.22 x10E6/uL (ref 4.14–5.80)
RDW: 12.7 % (ref 11.6–15.4)
WBC: 7.8 10*3/uL (ref 3.4–10.8)

## 2019-07-22 LAB — CMP14+EGFR
ALT: 18 IU/L (ref 0–44)
AST: 18 IU/L (ref 0–40)
Albumin/Globulin Ratio: 1.6 (ref 1.2–2.2)
Albumin: 4.6 g/dL (ref 3.7–4.7)
Alkaline Phosphatase: 117 IU/L (ref 48–121)
BUN/Creatinine Ratio: 33 — ABNORMAL HIGH (ref 10–24)
BUN: 26 mg/dL (ref 8–27)
Bilirubin Total: 0.5 mg/dL (ref 0.0–1.2)
CO2: 24 mmol/L (ref 20–29)
Calcium: 9.6 mg/dL (ref 8.6–10.2)
Chloride: 99 mmol/L (ref 96–106)
Creatinine, Ser: 0.79 mg/dL (ref 0.76–1.27)
GFR calc Af Amer: 104 mL/min/{1.73_m2} (ref >59)
GFR calc non Af Amer: 90 mL/min/{1.73_m2} (ref >59)
Globulin, Total: 2.9 g/dL (ref 1.5–4.5)
Glucose: 109 mg/dL — ABNORMAL HIGH (ref 65–99)
Potassium: 5.1 mmol/L (ref 3.5–5.2)
Sodium: 136 mmol/L (ref 134–144)
Total Protein: 7.5 g/dL (ref 6.0–8.5)

## 2019-07-22 LAB — LIPID PANEL
Chol/HDL Ratio: 3.2 ratio (ref 0.0–5.0)
Cholesterol, Total: 180 mg/dL (ref 100–199)
HDL: 56 mg/dL (ref >39)
LDL Chol Calc (NIH): 113 mg/dL — ABNORMAL HIGH (ref 0–99)
Triglycerides: 57 mg/dL (ref 0–149)
VLDL Cholesterol Cal: 11 mg/dL (ref 5–40)

## 2019-07-22 LAB — HEMOGLOBIN A1C
Est. average glucose Bld gHb Est-mCnc: 140 mg/dL
Hgb A1c MFr Bld: 6.5 % — ABNORMAL HIGH (ref 4.8–5.6)

## 2019-12-18 DIAGNOSIS — Z23 Encounter for immunization: Secondary | ICD-10-CM | POA: Diagnosis not present

## 2019-12-22 DIAGNOSIS — Z23 Encounter for immunization: Secondary | ICD-10-CM | POA: Diagnosis not present

## 2020-01-13 ENCOUNTER — Telehealth: Payer: Self-pay | Admitting: *Deleted

## 2020-01-13 ENCOUNTER — Other Ambulatory Visit: Payer: Self-pay | Admitting: *Deleted

## 2020-01-13 DIAGNOSIS — Z79899 Other long term (current) drug therapy: Secondary | ICD-10-CM

## 2020-01-13 DIAGNOSIS — R7301 Impaired fasting glucose: Secondary | ICD-10-CM

## 2020-01-13 DIAGNOSIS — Z125 Encounter for screening for malignant neoplasm of prostate: Secondary | ICD-10-CM

## 2020-01-13 DIAGNOSIS — I10 Essential (primary) hypertension: Secondary | ICD-10-CM

## 2020-01-13 DIAGNOSIS — Z13220 Encounter for screening for lipoid disorders: Secondary | ICD-10-CM

## 2020-01-13 DIAGNOSIS — R7303 Prediabetes: Secondary | ICD-10-CM

## 2020-01-14 ENCOUNTER — Encounter: Payer: Self-pay | Admitting: *Deleted

## 2020-01-14 NOTE — Telephone Encounter (Signed)
Sent mychart message

## 2020-01-19 DIAGNOSIS — R7303 Prediabetes: Secondary | ICD-10-CM | POA: Diagnosis not present

## 2020-01-19 DIAGNOSIS — Z125 Encounter for screening for malignant neoplasm of prostate: Secondary | ICD-10-CM | POA: Diagnosis not present

## 2020-01-19 DIAGNOSIS — I10 Essential (primary) hypertension: Secondary | ICD-10-CM | POA: Diagnosis not present

## 2020-01-19 DIAGNOSIS — Z13220 Encounter for screening for lipoid disorders: Secondary | ICD-10-CM | POA: Diagnosis not present

## 2020-01-20 LAB — CBC WITH DIFFERENTIAL/PLATELET
Basophils Absolute: 0 10*3/uL (ref 0.0–0.2)
Basos: 1 %
EOS (ABSOLUTE): 0.2 10*3/uL (ref 0.0–0.4)
Eos: 3 %
Hematocrit: 43.6 % (ref 37.5–51.0)
Hemoglobin: 14.9 g/dL (ref 13.0–17.7)
Immature Grans (Abs): 0 10*3/uL (ref 0.0–0.1)
Immature Granulocytes: 1 %
Lymphocytes Absolute: 1.4 10*3/uL (ref 0.7–3.1)
Lymphs: 22 %
MCH: 30.8 pg (ref 26.6–33.0)
MCHC: 34.2 g/dL (ref 31.5–35.7)
MCV: 90 fL (ref 79–97)
Monocytes Absolute: 0.6 10*3/uL (ref 0.1–0.9)
Monocytes: 9 %
Neutrophils Absolute: 4.1 10*3/uL (ref 1.4–7.0)
Neutrophils: 64 %
Platelets: 229 10*3/uL (ref 150–450)
RBC: 4.83 x10E6/uL (ref 4.14–5.80)
RDW: 11.9 % (ref 11.6–15.4)
WBC: 6.3 10*3/uL (ref 3.4–10.8)

## 2020-01-20 LAB — LIPID PANEL
Chol/HDL Ratio: 3.3 ratio (ref 0.0–5.0)
Cholesterol, Total: 176 mg/dL (ref 100–199)
HDL: 54 mg/dL (ref >39)
LDL Chol Calc (NIH): 113 mg/dL — ABNORMAL HIGH (ref 0–99)
Triglycerides: 46 mg/dL (ref 0–149)
VLDL Cholesterol Cal: 9 mg/dL (ref 5–40)

## 2020-01-20 LAB — CMP14+EGFR
ALT: 20 IU/L (ref 0–44)
AST: 16 IU/L (ref 0–40)
Albumin/Globulin Ratio: 1.5 (ref 1.2–2.2)
Albumin: 4.2 g/dL (ref 3.7–4.7)
Alkaline Phosphatase: 106 IU/L (ref 44–121)
BUN/Creatinine Ratio: 15 (ref 10–24)
BUN: 12 mg/dL (ref 8–27)
Bilirubin Total: 0.5 mg/dL (ref 0.0–1.2)
CO2: 23 mmol/L (ref 20–29)
Calcium: 9.7 mg/dL (ref 8.6–10.2)
Chloride: 102 mmol/L (ref 96–106)
Creatinine, Ser: 0.8 mg/dL (ref 0.76–1.27)
GFR calc Af Amer: 103 mL/min/{1.73_m2} (ref >59)
GFR calc non Af Amer: 89 mL/min/{1.73_m2} (ref >59)
Globulin, Total: 2.8 g/dL (ref 1.5–4.5)
Glucose: 120 mg/dL — ABNORMAL HIGH (ref 65–99)
Potassium: 4.5 mmol/L (ref 3.5–5.2)
Sodium: 138 mmol/L (ref 134–144)
Total Protein: 7 g/dL (ref 6.0–8.5)

## 2020-01-20 LAB — HEMOGLOBIN A1C
Est. average glucose Bld gHb Est-mCnc: 140 mg/dL
Hgb A1c MFr Bld: 6.5 % — ABNORMAL HIGH (ref 4.8–5.6)

## 2020-01-20 LAB — PSA: Prostate Specific Ag, Serum: 2.2 ng/mL (ref 0.0–4.0)

## 2020-01-25 ENCOUNTER — Other Ambulatory Visit: Payer: Self-pay

## 2020-01-25 ENCOUNTER — Ambulatory Visit (INDEPENDENT_AMBULATORY_CARE_PROVIDER_SITE_OTHER): Payer: Medicare Other | Admitting: Family Medicine

## 2020-01-25 ENCOUNTER — Encounter: Payer: Self-pay | Admitting: Family Medicine

## 2020-01-25 VITALS — BP 142/86 | HR 80 | Temp 97.8°F | Wt 199.8 lb

## 2020-01-25 DIAGNOSIS — I10 Essential (primary) hypertension: Secondary | ICD-10-CM | POA: Diagnosis not present

## 2020-01-25 DIAGNOSIS — E119 Type 2 diabetes mellitus without complications: Secondary | ICD-10-CM | POA: Diagnosis not present

## 2020-01-25 DIAGNOSIS — Z0000 Encounter for general adult medical examination without abnormal findings: Secondary | ICD-10-CM

## 2020-01-25 MED ORDER — ENALAPRIL MALEATE 10 MG PO TABS: 10.0000 mg | ORAL_TABLET | Freq: Every day | ORAL | 1 refills | Status: DC

## 2020-01-25 NOTE — Progress Notes (Signed)
Patient ID: Omar Taylor, male    DOB: [birth year], 74 y.o.   MRN: [id number]   Chief Complaint  Patient presents with  . Annual Exam   Subjective:    HPI  AWV- Annual Wellness Visit  The patient was seen for their annual wellness visit. The patient's past medical history, surgical history, and family history were reviewed. Pertinent vaccines were reviewed ( tetanus, pneumonia, shingles, flu) The patient's medication list was reviewed and updated.  The height and weight were entered.  BMI recorded in electronic record elsewhere  Cognitive screening was completed. Outcome of Mini - Cog: Pass   Falls /depression screening electronically recorded within record elsewhere  Recent listing of emergency department/hospitalizations over the past year were reviewed.  current specialist the patient sees on a regular basis: None   Medicare annual wellness visit patient questionnaire was reviewed.  A written screening schedule for the patient for the next 5-10 years was given. Appropriate discussion of followup regarding next visit was discussed.  Has 1c at 6.5 on last visit and this visit.  HTN Pt compliant with BP meds.  No SEs Denies chest pain, sob, LE swelling, or blurry vision.    Medical History Omar Taylor has a past medical history of Arthritis, Hypertension, and Impaired fasting glucose.   Outpatient Encounter Medications as of 01/25/2020  Medication Sig  . enalapril (VASOTEC) 10 MG tablet Take 1 tablet (10 mg total) by mouth daily.  . [DISCONTINUED] enalapril (VASOTEC) 5 MG tablet Take 1 tablet (5 mg total) by mouth daily.   No facility-administered encounter medications on file as of 01/25/2020.     Review of Systems  Constitutional: Negative for chills and fever.  HENT: Negative for congestion, rhinorrhea and sore throat.   Respiratory: Negative for cough, shortness of breath and wheezing.   Cardiovascular: Negative for chest pain and leg swelling.   Gastrointestinal: Negative for abdominal pain, diarrhea, nausea and vomiting.  Genitourinary: Negative for dysuria and frequency.  Skin: Negative for rash.  Neurological: Negative for dizziness, weakness and headaches.     Vitals BP (!) 142/86   Pulse 80   Temp 97.8 F (36.6 C)   Wt 199 lb 12.8 oz (90.6 kg)   SpO2 99%   BMI 27.29 kg/m   Objective:   Physical Exam Vitals and nursing note reviewed.  Constitutional:      General: He is not in acute distress.    Appearance: Normal appearance. He is not ill-appearing.  HENT:     Head: Normocephalic.     Nose: Nose normal. No congestion.     Mouth/Throat:     Mouth: Mucous membranes are moist.     Pharynx: No oropharyngeal exudate.  Eyes:     Extraocular Movements: Extraocular movements intact.     Conjunctiva/sclera: Conjunctivae normal.     Pupils: Pupils are equal, round, and reactive to light.  Cardiovascular:     Rate and Rhythm: Normal rate and regular rhythm.     Pulses: Normal pulses.     Heart sounds: Normal heart sounds. No murmur heard.   Pulmonary:     Effort: Pulmonary effort is normal.     Breath sounds: Normal breath sounds. No wheezing, rhonchi or rales.  Musculoskeletal:        General: Normal range of motion.     Right lower leg: No edema.     Left lower leg: No edema.  Skin:    General: Skin is warm and dry.  Findings: No rash.  Neurological:     General: No focal deficit present.     Mental Status: He is alert and oriented to person, place, and time.     Cranial Nerves: No cranial nerve deficit.  Psychiatric:        Mood and Affect: Mood normal.        Behavior: Behavior normal.        Thought Content: Thought content normal.        Judgment: Judgment normal.      Assessment and Plan   1. Medicare annual wellness visit, subsequent  2. Essential hypertension, benign - enalapril (VASOTEC) 10 MG tablet; Take 1 tablet (10 mg total) by mouth daily.  Dispense: 90 tablet; Refill: 1  3.  Type 2 diabetes mellitus without complication, without long-term current use of insulin (HCC)   hnt- suboptimal.  Pt was on enalapril 5mg  daily.  Will increase to 10mg  daily. Pt to work on dec salt.  Will recheck on next visit.  Dm2-Impaired fasting glucose- cont to monitor and dec carb intake in diet.  Reviewed foods to avoid and dec sodas/sweet tea.  Gave handout on diet modifications. a1c at 6.5.   HM- all up to date, just needing tdap.  F/u 35mo or prn.

## 2020-01-25 NOTE — Patient Instructions (Signed)
Carbohydrate Counting for Diabetes Mellitus, Adult  Carbohydrate counting is a method of keeping track of how many carbohydrates you eat. Eating carbohydrates naturally increases the amount of sugar (glucose) in the blood. Counting how many carbohydrates you eat helps keep your blood glucose within normal limits, which helps you manage your diabetes (diabetes mellitus). It is important to know how many carbohydrates you can safely have in each meal. This is different for every person. A diet and nutrition specialist (registered dietitian) can help you make a meal plan and calculate how many carbohydrates you should have at each meal and snack. Carbohydrates are found in the following foods:  Grains, such as breads and cereals.  Dried beans and soy products.  Starchy vegetables, such as potatoes, peas, and corn.  Fruit and fruit juices.  Milk and yogurt.  Sweets and snack foods, such as cake, cookies, candy, chips, and soft drinks. How do I count carbohydrates? There are two ways to count carbohydrates in food. You can use either of the methods or a combination of both. Reading "Nutrition Facts" on packaged food The "Nutrition Facts" list is included on the labels of almost all packaged foods and beverages in the [place] It includes:  The serving size.  Information about nutrients in each serving, including the grams (g) of carbohydrate per serving. To use the "Nutrition Facts":  Decide how many servings you will have.  Multiply the number of servings by the number of carbohydrates per serving.  The resulting number is the total amount of carbohydrates that you will be having. Learning standard serving sizes of other foods When you eat carbohydrate foods that are not packaged or do not include "Nutrition Facts" on the label, you need to measure the servings in order to count the amount of carbohydrates:  Measure the foods that you will eat with a food scale or measuring cup, if  needed.  Decide how many standard-size servings you will eat.  Multiply the number of servings by 15. Most carbohydrate-rich foods have about 15 g of carbohydrates per serving. ? For example, if you eat 8 oz (170 g) of strawberries, you will have eaten 2 servings and 30 g of carbohydrates (2 servings x 15 g = 30 g).  For foods that have more than one food mixed, such as soups and casseroles, you must count the carbohydrates in each food that is included. The following list contains standard serving sizes of common carbohydrate-rich foods. Each of these servings has about 15 g of carbohydrates:   hamburger bun or  English muffin.   oz (15 mL) syrup.   oz (14 g) jelly.  1 slice of bread.  1 six-inch tortilla.  3 oz (85 g) cooked rice or pasta.  4 oz (113 g) cooked dried beans.  4 oz (113 g) starchy vegetable, such as peas, corn, or potatoes.  4 oz (113 g) hot cereal.  4 oz (113 g) mashed potatoes or  of a large baked potato.  4 oz (113 g) canned or frozen fruit.  4 oz (120 mL) fruit juice.  4-6 crackers.  6 chicken nuggets.  6 oz (170 g) unsweetened dry cereal.  6 oz (170 g) plain fat-free yogurt or yogurt sweetened with artificial sweeteners.  8 oz (240 mL) milk.  8 oz (170 g) fresh fruit or one small piece of fruit.  24 oz (680 g) popped popcorn. Example of carbohydrate counting Sample meal  3 oz (85 g) chicken breast.  6 oz (170 g)   brown rice.  4 oz (113 g) corn.  8 oz (240 mL) milk.  8 oz (170 g) strawberries with sugar-free whipped topping. Carbohydrate calculation 1. Identify the foods that contain carbohydrates: ? Rice. ? Corn. ? Milk. ? Strawberries. 2. Calculate how many servings you have of each food: ? 2 servings rice. ? 1 serving corn. ? 1 serving milk. ? 1 serving strawberries. 3. Multiply each number of servings by 15 g: ? 2 servings rice x 15 g = 30 g. ? 1 serving corn x 15 g = 15 g. ? 1 serving milk x 15 g = 15 g. ? 1  serving strawberries x 15 g = 15 g. 4. Add together all of the amounts to find the total grams of carbohydrates eaten: ? 30 g + 15 g + 15 g + 15 g = 75 g of carbohydrates total. Summary  Carbohydrate counting is a method of keeping track of how many carbohydrates you eat.  Eating carbohydrates naturally increases the amount of sugar (glucose) in the blood.  Counting how many carbohydrates you eat helps keep your blood glucose within normal limits, which helps you manage your diabetes.  A diet and nutrition specialist (registered dietitian) can help you make a meal plan and calculate how many carbohydrates you should have at each meal and snack. This information is not intended to replace advice given to you by your health care provider. Make sure you discuss any questions you have with your health care provider. Document Revised: 08/09/2016 Document Reviewed: 06/29/2015 Elsevier Patient Education  2020 [employer]  

## 2020-01-29 DIAGNOSIS — E119 Type 2 diabetes mellitus without complications: Secondary | ICD-10-CM | POA: Insufficient documentation

## 2020-06-10 ENCOUNTER — Telehealth: Payer: Self-pay

## 2020-06-10 NOTE — Telephone Encounter (Signed)
6 month follow up on 06/28 will need blood work ordered   Pt call back [phone]

## 2020-06-10 NOTE — Telephone Encounter (Signed)
Last labs 01/19/20 psa, cmp, cbc, a1c, lipid

## 2020-06-13 ENCOUNTER — Other Ambulatory Visit: Payer: Self-pay | Admitting: *Deleted

## 2020-06-13 DIAGNOSIS — E119 Type 2 diabetes mellitus without complications: Secondary | ICD-10-CM

## 2020-06-13 DIAGNOSIS — Z79899 Other long term (current) drug therapy: Secondary | ICD-10-CM

## 2020-06-13 DIAGNOSIS — Z13220 Encounter for screening for lipoid disorders: Secondary | ICD-10-CM

## 2020-06-13 DIAGNOSIS — I10 Essential (primary) hypertension: Secondary | ICD-10-CM

## 2020-06-13 DIAGNOSIS — R7303 Prediabetes: Secondary | ICD-10-CM

## 2020-06-13 NOTE — Telephone Encounter (Signed)
Yes pls order cbc, cmp and lipids. And a1c.   Thx. Dr. Lovena Le

## 2020-06-13 NOTE — Telephone Encounter (Signed)
Bw orders put in and left pt a message to return call

## 2020-06-14 NOTE — Telephone Encounter (Signed)
Pt.notified

## 2020-07-19 DIAGNOSIS — E119 Type 2 diabetes mellitus without complications: Secondary | ICD-10-CM | POA: Diagnosis not present

## 2020-07-19 DIAGNOSIS — I10 Essential (primary) hypertension: Secondary | ICD-10-CM | POA: Diagnosis not present

## 2020-07-19 DIAGNOSIS — R7303 Prediabetes: Secondary | ICD-10-CM | POA: Diagnosis not present

## 2020-07-19 DIAGNOSIS — Z79899 Other long term (current) drug therapy: Secondary | ICD-10-CM | POA: Diagnosis not present

## 2020-07-19 DIAGNOSIS — Z13220 Encounter for screening for lipoid disorders: Secondary | ICD-10-CM | POA: Diagnosis not present

## 2020-07-20 LAB — COMPREHENSIVE METABOLIC PANEL
ALT: 16 IU/L (ref 0–44)
AST: 16 IU/L (ref 0–40)
Albumin/Globulin Ratio: 2.1 (ref 1.2–2.2)
Albumin: 4.6 g/dL (ref 3.7–4.7)
Alkaline Phosphatase: 104 IU/L (ref 44–121)
BUN/Creatinine Ratio: 31 — ABNORMAL HIGH (ref 10–24)
BUN: 21 mg/dL (ref 8–27)
Bilirubin Total: 0.5 mg/dL (ref 0.0–1.2)
CO2: 22 mmol/L (ref 20–29)
Calcium: 9.3 mg/dL (ref 8.6–10.2)
Chloride: 103 mmol/L (ref 96–106)
Creatinine, Ser: 0.68 mg/dL — ABNORMAL LOW (ref 0.76–1.27)
Globulin, Total: 2.2 g/dL (ref 1.5–4.5)
Glucose: 108 mg/dL — ABNORMAL HIGH (ref 65–99)
Potassium: 5.3 mmol/L — ABNORMAL HIGH (ref 3.5–5.2)
Sodium: 139 mmol/L (ref 134–144)
Total Protein: 6.8 g/dL (ref 6.0–8.5)
eGFR: 99 mL/min/{1.73_m2} (ref >59)

## 2020-07-20 LAB — CBC WITH DIFFERENTIAL/PLATELET
Basophils Absolute: 0 10*3/uL (ref 0.0–0.2)
Basos: 0 %
EOS (ABSOLUTE): 0.2 10*3/uL (ref 0.0–0.4)
Eos: 3 %
Hematocrit: 41.5 % (ref 37.5–51.0)
Hemoglobin: 14.4 g/dL (ref 13.0–17.7)
Immature Grans (Abs): 0 10*3/uL (ref 0.0–0.1)
Immature Granulocytes: 0 %
Lymphocytes Absolute: 1.6 10*3/uL (ref 0.7–3.1)
Lymphs: 23 %
MCH: 30.9 pg (ref 26.6–33.0)
MCHC: 34.7 g/dL (ref 31.5–35.7)
MCV: 89 fL (ref 79–97)
Monocytes Absolute: 0.6 10*3/uL (ref 0.1–0.9)
Monocytes: 9 %
Neutrophils Absolute: 4.4 10*3/uL (ref 1.4–7.0)
Neutrophils: 65 %
Platelets: 217 10*3/uL (ref 150–450)
RBC: 4.66 x10E6/uL (ref 4.14–5.80)
RDW: 12.1 % (ref 11.6–15.4)
WBC: 6.7 10*3/uL (ref 3.4–10.8)

## 2020-07-20 LAB — LIPID PANEL
Chol/HDL Ratio: 3.1 ratio (ref 0.0–5.0)
Cholesterol, Total: 165 mg/dL (ref 100–199)
HDL: 53 mg/dL (ref >39)
LDL Chol Calc (NIH): 104 mg/dL — ABNORMAL HIGH (ref 0–99)
Triglycerides: 34 mg/dL (ref 0–149)
VLDL Cholesterol Cal: 8 mg/dL (ref 5–40)

## 2020-07-20 LAB — HEMOGLOBIN A1C
Est. average glucose Bld gHb Est-mCnc: 137 mg/dL
Hgb A1c MFr Bld: 6.4 % — ABNORMAL HIGH (ref 4.8–5.6)

## 2020-07-26 ENCOUNTER — Encounter: Payer: Self-pay | Admitting: Family Medicine

## 2020-07-26 ENCOUNTER — Other Ambulatory Visit: Payer: Self-pay

## 2020-07-26 ENCOUNTER — Ambulatory Visit (INDEPENDENT_AMBULATORY_CARE_PROVIDER_SITE_OTHER): Payer: Medicare Other | Admitting: Family Medicine

## 2020-07-26 VITALS — BP 145/80 | HR 70 | Temp 98.2°F | Ht 71.0 in | Wt 196.0 lb

## 2020-07-26 DIAGNOSIS — H9193 Unspecified hearing loss, bilateral: Secondary | ICD-10-CM | POA: Diagnosis not present

## 2020-07-26 DIAGNOSIS — E119 Type 2 diabetes mellitus without complications: Secondary | ICD-10-CM | POA: Diagnosis not present

## 2020-07-26 DIAGNOSIS — I10 Essential (primary) hypertension: Secondary | ICD-10-CM | POA: Diagnosis not present

## 2020-07-26 MED ORDER — ENALAPRIL MALEATE 10 MG PO TABS: 10.0000 mg | ORAL_TABLET | Freq: Every day | ORAL | 1 refills | Status: DC

## 2020-07-26 NOTE — Patient Instructions (Addendum)
  Start tonight with 10mg  lisinopril.   Follow up in 11 wks for recheck of blood pressure.

## 2020-07-26 NOTE — Progress Notes (Signed)
Patient ID: Omar Taylor, male    DOB: 1945/10/13, 75 y.o.   MRN: [id number]   Chief Complaint  Patient presents with   Hypertension   Subjective:    HPI Follow up on htn and prediabetes. States he has been working on diet and exercise.  Working in yard.  A1c is at 6.4.  Cutting back on sweets and eating more veggies.  Pt stating wanting to work on his diet.  HTN Pt compliant with BP meds.  No SEs Denies chest pain, sob, LE swelling, or blurry vision. Taking lisinopril 10mg  last night. Pt didn't take 5mg  and double them up.   Pt stating not able to hear like he used to, if he can see them talking he's better. Feels can hear if he is in front of them.  BP Readings from Last 3 Encounters:  07/26/20 (!) 161/98  01/25/20 (!) 142/86  07/21/19 (!) 142/85   Not feeling his hearing is needing further evaluation.  Pt stating it is okay for now.   Medical History Omar Taylor has a past medical history of Arthritis, Hypertension, and Impaired fasting glucose.   Outpatient Encounter Medications as of 07/26/2020  Medication Sig   [DISCONTINUED] enalapril (VASOTEC) 10 MG tablet Take 1 tablet (10 mg total) by mouth daily.   enalapril (VASOTEC) 10 MG tablet Take 1 tablet (10 mg total) by mouth daily.   No facility-administered encounter medications on file as of 07/26/2020.     Review of Systems  Constitutional:  Negative for chills and fever.  HENT:  Positive for hearing loss. Negative for congestion, rhinorrhea and sore throat.   Respiratory:  Negative for cough, shortness of breath and wheezing.   Cardiovascular:  Negative for chest pain and leg swelling.  Gastrointestinal:  Negative for abdominal pain, diarrhea, nausea and vomiting.  Genitourinary:  Negative for dysuria and frequency.  Skin:  Negative for rash.  Neurological:  Negative for dizziness, weakness and headaches.    Vitals BP (!) 161/98   Pulse 70   Temp 98.2 F (36.8 C)   Ht 5\' 11"  (1.803 m)   Wt 196  lb (88.9 kg)   SpO2 98%   BMI 27.34 kg/m   Objective:   Physical Exam Vitals and nursing note reviewed.  Constitutional:      General: He is not in acute distress.    Appearance: Normal appearance. He is not ill-appearing.  HENT:     Head: Normocephalic.     Right Ear: Tympanic membrane, ear canal and external ear normal.     Left Ear: Tympanic membrane, ear canal and external ear normal.     Nose: Nose normal. No congestion.     Mouth/Throat:     Mouth: Mucous membranes are moist.     Pharynx: No oropharyngeal exudate.  Eyes:     Extraocular Movements: Extraocular movements intact.     Conjunctiva/sclera: Conjunctivae normal.     Pupils: Pupils are equal, round, and reactive to light.  Cardiovascular:     Rate and Rhythm: Normal rate and regular rhythm.     Pulses: Normal pulses.     Heart sounds: Normal heart sounds. No murmur heard. Pulmonary:     Effort: Pulmonary effort is normal.     Breath sounds: Normal breath sounds. No wheezing, rhonchi or rales.  Musculoskeletal:        General: Normal range of motion.     Right lower leg: No edema.     Left lower leg: No edema.  Skin:    General: Skin is warm and dry.     Findings: No rash.  Neurological:     General: No focal deficit present.     Mental Status: He is alert and oriented to person, place, and time.     Cranial Nerves: No cranial nerve deficit.  Psychiatric:        Mood and Affect: Mood normal.        Behavior: Behavior normal.        Thought Content: Thought content normal.        Judgment: Judgment normal.     Assessment and Plan   1. Essential hypertension, benign - enalapril (VASOTEC) 10 MG tablet; Take 1 tablet (10 mg total) by mouth daily.  Dispense: 90 tablet; Refill: 1  2. Decreased hearing of both ears  3. Type 2 diabetes mellitus without complication, without long-term current use of insulin (HCC)   Htn- suboptimal.  Pt only taking 5mg  lisinopril, didn't get the inc dose in 12/21 as  recommended to take 10mg  lisinopril. Bp improved on recheck. To 145/80. Advising to go back to the 10mg  lisinopril at night and f/u for recheck.  Decreased hearing-  likely related to age related changes. Pt declining audiology testing for hearing at this time.  Dm2- improved from a1c 6.5 now at 6.4.  cont to work on diet and exercising.   Recheck on next visit.  Not on meds for this.  Return in about 11 weeks (around 10/11/2020) for f/u htn.   07/26/2020

## 2020-09-18 ENCOUNTER — Other Ambulatory Visit: Payer: Self-pay | Admitting: Family Medicine

## 2020-10-11 ENCOUNTER — Other Ambulatory Visit: Payer: Self-pay

## 2020-10-11 ENCOUNTER — Ambulatory Visit (INDEPENDENT_AMBULATORY_CARE_PROVIDER_SITE_OTHER): Payer: Medicare Other | Admitting: Family Medicine

## 2020-10-11 DIAGNOSIS — I10 Essential (primary) hypertension: Secondary | ICD-10-CM | POA: Diagnosis not present

## 2020-10-11 MED ORDER — ENALAPRIL MALEATE 10 MG PO TABS: 10.0000 mg | ORAL_TABLET | Freq: Every day | ORAL | 0 refills | Status: AC

## 2020-10-11 NOTE — Progress Notes (Signed)
   Patient ID: Omar Taylor, male    DOB: 24-Jul-1945, 75 y.o.   MRN: [id number]   Chief Complaint  Patient presents with   Hypertension    Follow up    Subjective:    HPI HTN Pt compliant with BP meds.  No SEs Denies chest pain, sob, LE swelling, or blurry vision.    Medical History Aaidyn has a past medical history of Arthritis, Hypertension, and Impaired fasting glucose.   Outpatient Encounter Medications as of 10/11/2020  Medication Sig   enalapril (VASOTEC) 10 MG tablet Take 1 tablet (10 mg total) by mouth daily.   [DISCONTINUED] enalapril (VASOTEC) 10 MG tablet Take 1 tablet (10 mg total) by mouth daily.   [DISCONTINUED] enalapril (VASOTEC) 5 MG tablet TAKE 1 TABLET BY MOUTH EVERY DAY   No facility-administered encounter medications on file as of 10/11/2020.     Review of Systems Negative.  Vitals BP 138/88   Pulse 91   Ht '5\' 11"'$  (1.803 m)   Wt 198 lb 9.6 oz (90.1 kg)   BMI 27.70 kg/m   Objective:   Physical Exam Vitals and nursing note reviewed.  Constitutional:      General: He is not in acute distress.    Appearance: Normal appearance. He is not ill-appearing.  HENT:     Head: Normocephalic.     Nose: Nose normal. No congestion.     Mouth/Throat:     Mouth: Mucous membranes are moist.     Pharynx: No oropharyngeal exudate.  Eyes:     Extraocular Movements: Extraocular movements intact.     Conjunctiva/sclera: Conjunctivae normal.     Pupils: Pupils are equal, round, and reactive to light.  Cardiovascular:     Rate and Rhythm: Normal rate and regular rhythm.     Pulses: Normal pulses.     Heart sounds: Normal heart sounds. No murmur heard. Pulmonary:     Effort: Pulmonary effort is normal.     Breath sounds: Normal breath sounds. No wheezing, rhonchi or rales.  Musculoskeletal:        General: Normal range of motion.     Right lower leg: No edema.     Left lower leg: No edema.  Skin:    General: Skin is warm and dry.     Findings: No  rash.  Neurological:     General: No focal deficit present.     Mental Status: He is alert and oriented to person, place, and time.     Cranial Nerves: No cranial nerve deficit.  Psychiatric:        Mood and Affect: Mood normal.        Behavior: Behavior normal.        Thought Content: Thought content normal.        Judgment: Judgment normal.     Assessment and Plan   1. Essential hypertension, benign - enalapril (VASOTEC) 10 MG tablet; Take 1 tablet (10 mg total) by mouth daily.  Dispense: 90 tablet; Refill: 0   Stable. Cont meds.  Dec salt.  Cont to monitior at home.   No follow-ups on file.

## 2020-11-30 DIAGNOSIS — Z23 Encounter for immunization: Secondary | ICD-10-CM | POA: Diagnosis not present

## 2021-01-23 ENCOUNTER — Other Ambulatory Visit: Payer: Self-pay | Admitting: Family Medicine

## 2021-01-23 DIAGNOSIS — I10 Essential (primary) hypertension: Secondary | ICD-10-CM

## 2021-02-14 DIAGNOSIS — R7303 Prediabetes: Secondary | ICD-10-CM | POA: Diagnosis not present

## 2021-02-14 DIAGNOSIS — Z125 Encounter for screening for malignant neoplasm of prostate: Secondary | ICD-10-CM | POA: Diagnosis not present

## 2021-02-14 DIAGNOSIS — Z86010 Personal history of colonic polyps: Secondary | ICD-10-CM | POA: Diagnosis not present

## 2021-02-14 DIAGNOSIS — I10 Essential (primary) hypertension: Secondary | ICD-10-CM | POA: Diagnosis not present

## 2021-02-14 DIAGNOSIS — M160 Bilateral primary osteoarthritis of hip: Secondary | ICD-10-CM | POA: Diagnosis not present

## 2021-06-30 ENCOUNTER — Encounter (INDEPENDENT_AMBULATORY_CARE_PROVIDER_SITE_OTHER): Payer: Self-pay | Admitting: *Deleted

## 2021-08-02 ENCOUNTER — Encounter (INDEPENDENT_AMBULATORY_CARE_PROVIDER_SITE_OTHER): Payer: Self-pay | Admitting: *Deleted

## 2021-08-02 ENCOUNTER — Other Ambulatory Visit (INDEPENDENT_AMBULATORY_CARE_PROVIDER_SITE_OTHER): Payer: Self-pay

## 2021-08-02 DIAGNOSIS — Z86010 Personal history of colonic polyps: Secondary | ICD-10-CM

## 2021-08-02 DIAGNOSIS — Z800 Family history of malignant neoplasm of digestive organs: Secondary | ICD-10-CM

## 2021-08-08 ENCOUNTER — Telehealth (INDEPENDENT_AMBULATORY_CARE_PROVIDER_SITE_OTHER): Payer: Self-pay

## 2021-08-08 ENCOUNTER — Encounter (INDEPENDENT_AMBULATORY_CARE_PROVIDER_SITE_OTHER): Payer: Self-pay

## 2021-08-08 DIAGNOSIS — I10 Essential (primary) hypertension: Secondary | ICD-10-CM | POA: Diagnosis not present

## 2021-08-08 DIAGNOSIS — R7303 Prediabetes: Secondary | ICD-10-CM | POA: Diagnosis not present

## 2021-08-08 MED ORDER — PEG 3350-KCL-NA BICARB-NACL 420 G PO SOLR: 4000.0000 mL | ORAL | 0 refills | Status: DC

## 2021-08-08 NOTE — Telephone Encounter (Signed)
Referring MD/PCP: Dr Allie Dimmer  Procedure: Tcs  Reason/Indication:  hx of colon polyps, fam hx of colon ca  Has patient had this procedure before?  Yes   If so, when, by whom and where?  7-8 yrs ago   Is there a family history of colon cancer?  yes  Who?  What age when diagnosed?  Brother  Is patient diabetic? If yes, Type 1 or Type 2   no       Does patient have prosthetic heart valve or mechanical valve?  no  Do you have a pacemaker/defibrillator?  no  Has patient ever had endocarditis/atrial fibrillation? no  Does patient use oxygen? no  Has patient had joint replacement within last 12 months?  no  Is patient constipated or do they take laxatives? no  Does patient have a history of alcohol/drug use?  no  Have you had a stroke/heart attack last 6 mths? no  Do you take medicine for weight loss?  no  For male patients,: have you had a hysterectomy n/a                      are you post menopausal n/a                      do you still have your menstrual cycle n/a  Is patient on blood thinner such as Coumadin, Plavix and/or Aspirin? No   Medications: Enalapril 10 mg daily  Allergies: NKDA  Medication Adjustment per Dr Jenetta Downer NONE  Procedure date & time: 09/08/21 at 135

## 2021-08-08 NOTE — Telephone Encounter (Signed)
Omar Taylor Ann Mahina Salatino, CMA  ?

## 2021-08-08 NOTE — Telephone Encounter (Signed)
Ok to schedule.  Thanks,  Hall Birchard Castaneda Mayorga, MD Gastroenterology and Hepatology [facility]  

## 2021-08-17 DIAGNOSIS — Z0000 Encounter for general adult medical examination without abnormal findings: Secondary | ICD-10-CM | POA: Diagnosis not present

## 2021-08-17 DIAGNOSIS — Z23 Encounter for immunization: Secondary | ICD-10-CM | POA: Diagnosis not present

## 2021-09-04 ENCOUNTER — Other Ambulatory Visit: Payer: Self-pay | Admitting: Family Medicine

## 2021-09-04 DIAGNOSIS — I10 Essential (primary) hypertension: Secondary | ICD-10-CM

## 2021-09-08 ENCOUNTER — Encounter (HOSPITAL_COMMUNITY): Payer: Self-pay | Admitting: Gastroenterology

## 2021-09-08 ENCOUNTER — Ambulatory Visit (HOSPITAL_COMMUNITY): Payer: Medicare Other | Admitting: Anesthesiology

## 2021-09-08 ENCOUNTER — Ambulatory Visit (HOSPITAL_BASED_OUTPATIENT_CLINIC_OR_DEPARTMENT_OTHER): Payer: Medicare Other | Admitting: Anesthesiology

## 2021-09-08 ENCOUNTER — Other Ambulatory Visit: Payer: Self-pay

## 2021-09-08 ENCOUNTER — Encounter (HOSPITAL_COMMUNITY)
Admission: RE | Disposition: A | Discharge status: Home / Self Care | Payer: Self-pay | Source: Home / Self Care | Attending: Gastroenterology

## 2021-09-08 ENCOUNTER — Ambulatory Visit (HOSPITAL_COMMUNITY)
Admission: RE | Admit: 2021-09-08 | Discharge: 2021-09-08 | Disposition: A | Discharge status: Home / Self Care | Payer: Medicare Other | Attending: Gastroenterology | Admitting: Gastroenterology

## 2021-09-08 DIAGNOSIS — D120 Benign neoplasm of cecum: Secondary | ICD-10-CM | POA: Diagnosis not present

## 2021-09-08 DIAGNOSIS — Z86010 Personal history of colonic polyps: Secondary | ICD-10-CM

## 2021-09-08 DIAGNOSIS — K5730 Diverticulosis of large intestine without perforation or abscess without bleeding: Secondary | ICD-10-CM | POA: Diagnosis not present

## 2021-09-08 DIAGNOSIS — K635 Polyp of colon: Secondary | ICD-10-CM | POA: Diagnosis not present

## 2021-09-08 DIAGNOSIS — Z800 Family history of malignant neoplasm of digestive organs: Secondary | ICD-10-CM | POA: Insufficient documentation

## 2021-09-08 DIAGNOSIS — Z1211 Encounter for screening for malignant neoplasm of colon: Secondary | ICD-10-CM | POA: Insufficient documentation

## 2021-09-08 DIAGNOSIS — Z09 Encounter for follow-up examination after completed treatment for conditions other than malignant neoplasm: Secondary | ICD-10-CM

## 2021-09-08 DIAGNOSIS — I10 Essential (primary) hypertension: Secondary | ICD-10-CM | POA: Insufficient documentation

## 2021-09-08 DIAGNOSIS — E119 Type 2 diabetes mellitus without complications: Secondary | ICD-10-CM | POA: Diagnosis not present

## 2021-09-08 HISTORY — PX: COLONOSCOPY WITH PROPOFOL: SHX5780

## 2021-09-08 HISTORY — PX: POLYPECTOMY: SHX5525

## 2021-09-08 HISTORY — PX: BIOPSY: SHX5522

## 2021-09-08 LAB — HM COLONOSCOPY

## 2021-09-08 SURGERY — COLONOSCOPY WITH PROPOFOL
Anesthesia: General

## 2021-09-08 MED ORDER — PHENYLEPHRINE HCL (PRESSORS) 10 MG/ML IV SOLN
INTRAVENOUS | Status: DC | PRN
  Administered 2021-09-08: 160 ug via INTRAVENOUS

## 2021-09-08 MED ORDER — LACTATED RINGERS IV SOLN: INTRAVENOUS | Status: DC | PRN

## 2021-09-08 MED ORDER — LACTATED RINGERS IV SOLN: INTRAVENOUS | Status: DC

## 2021-09-08 MED ORDER — PROPOFOL 10 MG/ML IV BOLUS
INTRAVENOUS | Status: DC | PRN
  Administered 2021-09-08 (×2): 20 mg via INTRAVENOUS
  Administered 2021-09-08: 50 mg via INTRAVENOUS
  Administered 2021-09-08 (×2): 20 mg via INTRAVENOUS
  Administered 2021-09-08: 30 mg via INTRAVENOUS
  Administered 2021-09-08 (×2): 20 mg via INTRAVENOUS

## 2021-09-08 NOTE — Op Note (Signed)
[facility] Patient Name: Omar Taylor Procedure Date: 09/08/2021 7:06 AM MRN: [id number] Date of Birth: 1946-01-05 Attending MD: Maylon Peppers ,  CSN: [id number] Age: 76 Admit Type: Outpatient Procedure:                Colonoscopy Indications:              Surveillance: Personal history of adenomatous                            polyps on last colonoscopy > 5 years ago Providers:                Maylon Peppers, Janeece Riggers, RN, Rosina Lowenstein, RN Referring MD:              Medicines:                Monitored Anesthesia Care Complications:            No immediate complications. Estimated Blood Loss:     Estimated blood loss: none. Procedure:                Pre-Anesthesia Assessment:                           - Prior to the procedure, a History and Physical                            was performed, and patient medications, allergies                            and sensitivities were reviewed. The patient's                            tolerance of previous anesthesia was reviewed.                           - The risks and benefits of the procedure and the                            sedation options and risks were discussed with the                            patient. All questions were answered and informed                            consent was obtained.                           - ASA Grade Assessment: II - A patient with mild                            systemic disease.                           After obtaining informed consent, the colonoscope                            was passed under  direct vision. Throughout the                            procedure, the patient's blood pressure, pulse, and                            oxygen saturations were monitored continuously. The                            [id number]-HQ190L ([id number]) scope was introduced through                            the anus and advanced to the the cecum, identified                            by appendiceal orifice and  ileocecal valve. The                            colonoscopy was performed without difficulty. The                            patient tolerated the procedure well. The quality                            of the bowel preparation was adequate. Scope In: 7:45:29 AM Scope Out: 8:12:04 AM Scope Withdrawal Time: 0 hours 21 minutes 59 seconds  Total Procedure Duration: 0 hours 26 minutes 35 seconds  Findings:      The perianal and digital rectal examinations were normal.      A 8 mm polyp was found in the cecum. The polyp was semi-sessile. The       polyp was removed with a cold snare. Resection was complete, but the       polyp tissue was only partially retrieved (only the rim was biopsied       with a forceps).      Multiple small and large-mouthed diverticula were found in the sigmoid       colon, descending colon, transverse colon and ascending colon.      The retroflexed view of the distal rectum and anal verge was normal and       showed no anal or rectal abnormalities. Impression:               - One 8 mm polyp in the cecum, removed with a cold                            snare. Complete resection. Partial retrieval.                           - Diverticulosis in the sigmoid colon, in the                            descending colon, in the transverse colon and in                            the ascending colon.                           -  The distal rectum and anal verge are normal on                            retroflexion view. Moderate Sedation:      Per Anesthesia Care Recommendation:           - Discharge patient to home (ambulatory).                           - Resume previous diet.                           - Await pathology results.                           - Repeat colonoscopy in 5 years for surveillance. Procedure Code(s):        --- Professional ---                           [phone], Colonoscopy, flexible; with removal of                            tumor(s), polyp(s), or other  lesion(s) by snare                            technique Diagnosis Code(s):        --- Professional ---                           Z86.010, Personal history of colonic polyps                           K63.5, Polyp of colon                           K57.30, Diverticulosis of large intestine without                            perforation or abscess without bleeding CPT copyright 2019 [employer]. All rights reserved. The codes documented in this report are preliminary and upon coder review may  be revised to meet current compliance requirements. Maylon Peppers, MD Maylon Peppers,  09/08/2021 8:16:52 AM This report has been signed electronically. Number of Addenda: 0

## 2021-09-08 NOTE — Anesthesia Postprocedure Evaluation (Signed)
Anesthesia Post Note  Patient: Omar Taylor  Procedure(s) Performed: COLONOSCOPY WITH PROPOFOL POLYPECTOMY BIOPSY  Patient location during evaluation: Phase II Anesthesia Type: General Level of consciousness: awake and alert and oriented Pain management: pain level controlled Vital Signs Assessment: post-procedure vital signs reviewed and stable Respiratory status: spontaneous breathing, nonlabored ventilation and respiratory function stable Cardiovascular status: blood pressure returned to baseline and stable Postop Assessment: no apparent nausea or vomiting Anesthetic complications: no   No notable events documented.   Last Vitals:  Vitals:   09/08/21 0816 09/08/21 0818  BP: 104/81 104/68  Pulse: 70 64  Resp: 16 16  Temp: (!) 36.4 C 37.4 C  SpO2: 100% 98%    Last Pain:  Vitals:   09/08/21 0818  TempSrc: Oral  PainSc: 0-No pain                 Todd Argabright C Brayden Betters

## 2021-09-08 NOTE — Anesthesia Preprocedure Evaluation (Addendum)
Anesthesia Evaluation  Patient identified by MRN, date of birth, ID band Patient awake    Reviewed: Allergy & Precautions, NPO status , Patient's Chart, lab work & pertinent test results  Airway Mallampati: II  TM Distance: >3 FB Neck ROM: Full    Dental  (+) Dental Advisory Given, Teeth Intact   Pulmonary neg pulmonary ROS,    Pulmonary exam normal breath sounds clear to auscultation       Cardiovascular Exercise Tolerance: Good hypertension, Pt. on medications Normal cardiovascular exam Rhythm:Regular Rate:Normal     Neuro/Psych negative neurological ROS  negative psych ROS   GI/Hepatic negative GI ROS, Neg liver ROS,   Endo/Other  diabetes (pre), Well Controlled, Type 2  Renal/GU negative Renal ROS  negative genitourinary   Musculoskeletal  (+) Arthritis , Osteoarthritis,    Abdominal   Peds negative pediatric ROS (+)  Hematology negative hematology ROS (+)   Anesthesia Other Findings   Reproductive/Obstetrics negative OB ROS                            Anesthesia Physical Anesthesia Plan  ASA: 2  Anesthesia Plan: General   Post-op Pain Management: Minimal or no pain anticipated   Induction: Intravenous  PONV Risk Score and Plan: Propofol infusion  Airway Management Planned: Nasal Cannula and Natural Airway  Additional Equipment:   Intra-op Plan:   Post-operative Plan:   Informed Consent: I have reviewed the patients History and Physical, chart, labs and discussed the procedure including the risks, benefits and alternatives for the proposed anesthesia with the patient or authorized representative who has indicated his/her understanding and acceptance.     Dental advisory given  Plan Discussed with: CRNA and Surgeon  Anesthesia Plan Comments:         Anesthesia Quick Evaluation

## 2021-09-08 NOTE — Discharge Instructions (Signed)
You are being discharged to home.  Resume your previous diet.  We are waiting for your pathology results.  Your physician has recommended a repeat colonoscopy in five years for surveillance.  

## 2021-09-08 NOTE — H&P (Signed)
Omar Taylor is an 76 y.o. male.   Chief Complaint: history colon polyps HPI: 76 y/o M with PMH HTN, arthritis, coming for evaluation of history of colon polyps.  Last colonoscopy was performed in 2016, he was found to have 2 tubular adenomas.  The patient denies having any complaints such as melena, hematochezia, abdominal pain or distention, change in her bowel movement consistency or frequency, no changes in weight recently.  Had brother colon cancer in his 41s.   Past Medical History:  Diagnosis Date   Arthritis    Hypertension    Impaired fasting glucose     Past Surgical History:  Procedure Laterality Date   APPENDECTOMY  1954   COLONOSCOPY N/A 06/16/2014   Procedure: COLONOSCOPY;  Surgeon: Rogene Houston, MD;  Location: [facility];  Service: Endoscopy;  Laterality: N/A;  730   hemmorhoid  2007   HERNIA REPAIR  2014   INGUINAL HERNIA REPAIR Right 11/10/2012   Procedure: HERNIA REPAIR INGUINAL ADULT;  Surgeon: Jamesetta So, MD;  Location: [facility] ORS;  Service: General;  Laterality: Right;   JOINT REPLACEMENT Bilateral 2007   hip replacement, [facility]    Family History  Problem Relation Age of Onset   Colon cancer Brother    Social History:  reports that he has never smoked. He has never used smokeless tobacco. He reports that he does not currently use alcohol. He reports that he does not currently use drugs.  Allergies: No Known Allergies  Medications Prior to Admission  Medication Sig Dispense Refill   enalapril (VASOTEC) 10 MG tablet Take 1 tablet (10 mg total) by mouth daily. (Patient taking differently: Take 10 mg by mouth daily after supper.) 90 tablet 0   polyethylene glycol-electrolytes (TRILYTE) 420 g solution Take 4,000 mLs by mouth as directed. 4000 mL 0    No results found for this or any previous visit (from the past 48 hour(s)). No results found.  Review of Systems  All other systems reviewed and are negative.   Blood pressure (!) 146/92, pulse 68,  temperature 97.8 F (36.6 C), temperature source Oral, resp. rate 17, height 6' (1.829 m), weight 83.9 kg, SpO2 97 %. Physical Exam  GENERAL: The patient is AO x3, in no acute distress. HEENT: Head is normocephalic and atraumatic. EOMI are intact. Mouth is well hydrated and without lesions. NECK: Supple. No masses LUNGS: Clear to auscultation. No presence of rhonchi/wheezing/rales. Adequate chest expansion HEART: RRR, normal s1 and s2. ABDOMEN: Soft, nontender, no guarding, no peritoneal signs, and nondistended. BS +. No masses. EXTREMITIES: Without any cyanosis, clubbing, rash, lesions or edema. NEUROLOGIC: AOx3, no focal motor deficit. SKIN: no jaundice, no rashes  Assessment/Plan 76 y/o M with PMH HTN, arthritis, coming for evaluation of history of colon polyps.  Will proceed with colonoscopy.  Harvel Quale, MD 09/08/2021, 7:33 AM

## 2021-09-08 NOTE — Transfer of Care (Signed)
Immediate Anesthesia Transfer of Care Note  Patient: KEAVON SENSING  Procedure(s) Performed: COLONOSCOPY WITH PROPOFOL POLYPECTOMY BIOPSY  Patient Location: PACU  Anesthesia Type:MAC  Level of Consciousness: awake, alert  and oriented  Airway & Oxygen Therapy: Patient Spontanous Breathing  Post-op Assessment: Report given to RN and Post -op Vital signs reviewed and stable  Post vital signs: Reviewed and stable  Last Vitals:  Vitals Value Taken Time  BP 104/68 09/08/21 0818  Temp 37.4 C 09/08/21 0818  Pulse 64 09/08/21 0818  Resp 16 09/08/21 0818  SpO2 98 % 09/08/21 0818    Last Pain:  Vitals:   09/08/21 0818  TempSrc: Oral  PainSc: 0-No pain      Patients Stated Pain Goal: 8 (65/53/74 8270)  Complications: No notable events documented.

## 2021-09-11 ENCOUNTER — Encounter (INDEPENDENT_AMBULATORY_CARE_PROVIDER_SITE_OTHER): Payer: Self-pay | Admitting: *Deleted

## 2021-09-11 LAB — SURGICAL PATHOLOGY

## 2021-09-13 ENCOUNTER — Encounter (HOSPITAL_COMMUNITY): Payer: Self-pay | Admitting: Gastroenterology
